# Patient Record
Sex: Male | Born: 1970 | Race: Black or African American | Hispanic: No | Marital: Single | Smoking: Never smoker
Health system: Southern US, Community
[De-identification: ages and names within clinical notes are randomized; demographics above are authoritative.]

## PROBLEM LIST (undated history)

## (undated) ENCOUNTER — Ambulatory Visit (HOSPITAL_COMMUNITY): Disposition: A | Discharge status: Home / Self Care | Payer: No Typology Code available for payment source

## (undated) DIAGNOSIS — L02212 Cutaneous abscess of back [any part, except buttock]: Secondary | ICD-10-CM

---

## 2005-08-07 ENCOUNTER — Emergency Department (HOSPITAL_COMMUNITY): Admission: EM | Admit: 2005-08-07 | Discharge: 2005-08-07 | Payer: Self-pay | Admitting: Emergency Medicine

## 2008-12-10 ENCOUNTER — Encounter: Admission: RE | Admit: 2008-12-10 | Discharge: 2008-12-10 | Payer: Self-pay | Admitting: Internal Medicine

## 2010-09-23 ENCOUNTER — Inpatient Hospital Stay (INDEPENDENT_AMBULATORY_CARE_PROVIDER_SITE_OTHER)
Admission: RE | Admit: 2010-09-23 | Discharge: 2010-09-23 | Disposition: A | Discharge status: Home / Self Care | Payer: BLUE CROSS/BLUE SHIELD | Source: Ambulatory Visit | Attending: Family Medicine | Admitting: Family Medicine

## 2010-09-23 DIAGNOSIS — K047 Periapical abscess without sinus: Secondary | ICD-10-CM

## 2011-02-19 ENCOUNTER — Inpatient Hospital Stay (INDEPENDENT_AMBULATORY_CARE_PROVIDER_SITE_OTHER)
Admission: RE | Admit: 2011-02-19 | Discharge: 2011-02-19 | Disposition: A | Discharge status: Home / Self Care | Payer: Self-pay | Source: Ambulatory Visit | Attending: Family Medicine | Admitting: Family Medicine

## 2011-02-19 DIAGNOSIS — L02419 Cutaneous abscess of limb, unspecified: Secondary | ICD-10-CM

## 2011-02-19 DIAGNOSIS — L03119 Cellulitis of unspecified part of limb: Secondary | ICD-10-CM

## 2011-02-21 ENCOUNTER — Inpatient Hospital Stay (HOSPITAL_COMMUNITY)
Admission: RE | Admit: 2011-02-21 | Discharge: 2011-02-21 | Disposition: A | Discharge status: Home / Self Care | Payer: Self-pay | Source: Ambulatory Visit | Attending: Family Medicine | Admitting: Family Medicine

## 2011-08-03 ENCOUNTER — Encounter (HOSPITAL_COMMUNITY): Payer: Self-pay | Admitting: *Deleted

## 2011-08-03 ENCOUNTER — Emergency Department (INDEPENDENT_AMBULATORY_CARE_PROVIDER_SITE_OTHER)
Admission: EM | Admit: 2011-08-03 | Discharge: 2011-08-03 | Disposition: A | Discharge status: Home Health | Payer: Self-pay | Source: Home / Self Care | Attending: Emergency Medicine | Admitting: Emergency Medicine

## 2011-08-03 DIAGNOSIS — L02219 Cutaneous abscess of trunk, unspecified: Secondary | ICD-10-CM

## 2011-08-03 DIAGNOSIS — L02212 Cutaneous abscess of back [any part, except buttock]: Secondary | ICD-10-CM

## 2011-08-03 MED ORDER — IBUPROFEN 600 MG PO TABS: 600.0000 mg | ORAL_TABLET | Freq: Four times a day (QID) | ORAL | Status: AC | PRN

## 2011-08-03 MED ORDER — CHLORHEXIDINE GLUCONATE 4 % EX LIQD: 60.0000 mL | Freq: Every day | CUTANEOUS | Status: AC | PRN

## 2011-08-03 MED ORDER — BACITRACIN 500 UNIT/GM EX OINT
1.0000 "application " | TOPICAL_OINTMENT | Freq: Once | CUTANEOUS | Status: AC
  Administered 2011-08-03: 1 via TOPICAL

## 2011-08-03 MED ORDER — SULFAMETHOXAZOLE-TRIMETHOPRIM 800-160 MG PO TABS: 1.0000 | ORAL_TABLET | Freq: Two times a day (BID) | ORAL | Status: AC

## 2011-08-03 NOTE — ED Notes (Signed)
Pt is here with what appears to be MRSA bump on mid back.  Onset 4 days ago.

## 2011-08-03 NOTE — ED Provider Notes (Signed)
History     CSN: [id number]  Arrival date & time 08/03/11  8657   First MD Initiated Contact with Patient 08/03/11 [phone]      Chief Complaint  Patient presents with  . Rash    (Consider location/radiation/quality/duration/timing/severity/associated sxs/prior treatment) HPI Comments: Patient with a painful, erythematous mass starting his midback approximately 4 days ago. States he scratched the area, and then the symptoms started. No nausea, vomiting, fevers. No purulent drainage. No other lesions elsewhere. Has not tried anything for this. No history of MRSA infections, or diabetes.  Patient is a 41 y.o. male presenting with rash. No language interpreter was used.  Rash  This is a new problem. The current episode started more than 2 days ago. The problem has been gradually worsening. The problem is associated with nothing. There has been no fever. The rash is present on the back. Associated symptoms include itching and pain. Pertinent negatives include no blisters. He has tried nothing for the symptoms. The treatment provided no relief.    History reviewed. No pertinent past medical history.  History reviewed. No pertinent past surgical history.  History reviewed. No pertinent family history.  History  Substance Use Topics  . Smoking status: Never Smoker   . Smokeless tobacco: Not on file  . Alcohol Use: Yes      Review of Systems  Constitutional: Negative for fever.  Gastrointestinal: Positive for nausea. Negative for vomiting.  Musculoskeletal: Positive for back pain.  Skin: Positive for itching and rash.    Allergies  Review of patient's allergies indicates no known allergies.  Home Medications   Current Outpatient Rx  Name Route Sig Dispense Refill  . CHLORHEXIDINE GLUCONATE 4 % EX LIQD Topical Apply 60 mLs (4 application total) topically daily as needed. Use daily for 1-2 weeks 120 mL 0  . IBUPROFEN 600 MG PO TABS Oral Take 1 tablet (600 mg total) by mouth  every 6 (six) hours as needed for pain. 30 tablet 0  . SULFAMETHOXAZOLE-TRIMETHOPRIM 800-160 MG PO TABS Oral Take 1 tablet by mouth 2 (two) times daily. 20 tablet 0    BP 110/76  Pulse 84  Temp(Src) 97 F (36.1 C) (Oral)  Resp 16  SpO2 97%  Physical Exam  Nursing note and vitals reviewed. Constitutional: He is oriented to person, place, and time. He appears well-developed and well-nourished.  HENT:  Head: Normocephalic and atraumatic.  Eyes: Conjunctivae and EOM are normal.  Neck: Normal range of motion.  Cardiovascular: Normal rate.   Pulmonary/Chest: Effort normal. No respiratory distress.  Abdominal: He exhibits no distension.  Musculoskeletal: Normal range of motion.  Neurological: He is alert and oriented to person, place, and time.  Skin: Skin is warm and dry.       1 x 1.5 cm area of redness, induration. Central whitehead.  Psychiatric: He has a normal mood and affect. His behavior is normal.    ED Course  INCISION AND DRAINAGE Date/Time: 08/03/2011 9:42 AM Performed by: Luiz Blare Authorized by: Luiz Blare Consent: Verbal consent obtained. Risks and benefits: risks, benefits and alternatives were discussed Consent given by: patient Patient understanding: patient states understanding of the procedure being performed Patient consent: the patient's understanding of the procedure matches consent given Site marked: the operative site was marked Required items: required blood products, implants, devices, and special equipment available Patient identity confirmed: verbally with patient Time out: Immediately prior to procedure a "time out" was called to verify the correct patient, procedure, equipment, support  staff and site/side marked as required. Type: abscess Body area: trunk Location details: back Patient sedated: no Needle gauge: 18 Incision type: single straight Complexity: simple Drainage: purulent and bloody Drainage amount: scant Wound  treatment: wound left open Patient tolerance: Patient tolerated the procedure well with no immediate complications. Comments: Applied bacitracin, sterile dressing.   (including critical care time)   Labs Reviewed  CULTURE, ROUTINE-ABSCESS   No results found.   1. Abscess of back       MDM  Performed needle drainage of abscess. Marked margins of induration with permanent marker. Measured approximately 1 x 1.5 cm. Self on call to confirm MRSA Sending home with Bactrim, pain medication. Was advised to start warm compresses. Patient followup here or with his Dr. in 3 days if no improvement.  Luiz Blare, MD 08/03/11 [phone]

## 2011-08-05 LAB — CULTURE, ROUTINE-ABSCESS: Gram Stain: NONE SEEN

## 2011-08-07 ENCOUNTER — Telehealth (HOSPITAL_COMMUNITY): Payer: Self-pay | Admitting: *Deleted

## 2011-08-07 NOTE — ED Notes (Signed)
Abscess culture back: Mod. MRSA.  Pt. Adequately treated with Septra DS. Pt.verified x 2 and given result. Pt. Given MRSA instructions and voiced understanding.  Pt. said its still hard and stopped draining. I told him it may need an I and D. Pt. instructed to come back if not better after finishing his antibiotics. Jeremy Singh 08/07/2011

## 2011-11-21 ENCOUNTER — Encounter (HOSPITAL_COMMUNITY): Payer: Self-pay | Admitting: *Deleted

## 2011-11-21 ENCOUNTER — Emergency Department (INDEPENDENT_AMBULATORY_CARE_PROVIDER_SITE_OTHER)
Admission: EM | Admit: 2011-11-21 | Discharge: 2011-11-21 | Disposition: A | Discharge status: Home / Self Care | Payer: Self-pay | Source: Home / Self Care | Attending: Emergency Medicine | Admitting: Emergency Medicine

## 2011-11-21 DIAGNOSIS — L03119 Cellulitis of unspecified part of limb: Secondary | ICD-10-CM

## 2011-11-21 DIAGNOSIS — L03115 Cellulitis of right lower limb: Secondary | ICD-10-CM

## 2011-11-21 MED ORDER — HYDROCODONE-ACETAMINOPHEN 5-325 MG PO TABS: 2.0000 | ORAL_TABLET | ORAL | Status: AC | PRN

## 2011-11-21 MED ORDER — SULFAMETHOXAZOLE-TRIMETHOPRIM 800-160 MG PO TABS: 1.0000 | ORAL_TABLET | Freq: Two times a day (BID) | ORAL | Status: AC

## 2011-11-21 MED ORDER — IBUPROFEN 600 MG PO TABS: 600.0000 mg | ORAL_TABLET | Freq: Four times a day (QID) | ORAL | Status: AC | PRN

## 2011-11-21 MED ORDER — BACITRACIN 500 UNIT/GM EX OINT
1.0000 "application " | TOPICAL_OINTMENT | Freq: Once | CUTANEOUS | Status: AC
  Administered 2011-11-21: 1 via TOPICAL

## 2011-11-21 NOTE — ED Provider Notes (Signed)
History     CSN: [id number]  Arrival date & time 11/21/11  1145   First MD Initiated Contact with Patient 11/21/11 1234      Chief Complaint  Patient presents with  . Leg Pain  . Torticollis    (Consider location/radiation/quality/duration/timing/severity/associated sxs/prior treatment) HPI Comments: Patient with history of recurrent skin infections comes in with right lower extremity erythema, pain, swelling starting yesterday. States that he had a "bump" on the posterior part of his leg, which he scratched, and then his leg started to swell up. Has been putting rubbing alcohol on this. Pain is worse with palpation. No alleviating factors. Has not tried anything other than alcohol. He is not a diabetic.  ROS as noted in HPI. All other ROS negative.   Patient is a 41 y.o. male presenting with abscess. The history is provided by the patient. No language interpreter was used.  Abscess  The abscess is present on the right lower leg. The problem is moderate. The abscess is characterized by redness and painfulness. It is unknown what he was exposed to. The abscess first occurred at home. Pertinent negatives include no fever.    History reviewed. No pertinent past medical history.  History reviewed. No pertinent past surgical history.  History reviewed. No pertinent family history.  History  Substance Use Topics  . Smoking status: Never Smoker   . Smokeless tobacco: Not on file  . Alcohol Use: Yes      Review of Systems  Constitutional: Negative for fever.    Allergies  Review of patient's allergies indicates no known allergies.  Home Medications   Current Outpatient Rx  Name Route Sig Dispense Refill  . HYDROCODONE-ACETAMINOPHEN 5-325 MG PO TABS Oral Take 2 tablets by mouth every 4 (four) hours as needed for pain. 20 tablet 0  . IBUPROFEN 600 MG PO TABS Oral Take 1 tablet (600 mg total) by mouth every 6 (six) hours as needed for pain. 30 tablet 0  .  SULFAMETHOXAZOLE-TRIMETHOPRIM 800-160 MG PO TABS Oral Take 1 tablet by mouth 2 (two) times daily. X 10 days 20 tablet 0    BP 119/68  Pulse 74  Temp 98.2 F (36.8 C) (Oral)  Resp 18  SpO2 98%  Physical Exam  Nursing note and vitals reviewed. Constitutional: He is oriented to person, place, and time. He appears well-developed and well-nourished.  HENT:  Head: Normocephalic and atraumatic.  Eyes: Conjunctivae and EOM are normal.  Neck: Normal range of motion.  Cardiovascular: Normal rate.   Pulmonary/Chest: Effort normal. No respiratory distress.  Abdominal: He exhibits no distension.  Musculoskeletal: Normal range of motion.       Right lower leg: He exhibits tenderness and edema.       Legs:      Tender nonpitting erythema right lower extremity, see drawing. Central lesion with small amount of fluctuance. No expressible drainage.  Neurological: He is alert and oriented to person, place, and time.  Skin: Skin is warm and dry.  Psychiatric: He has a normal mood and affect. His behavior is normal.    ED Course  INCISION AND DRAINAGE Date/Time: 11/21/2011 11:53 AM Performed by: Luiz Blare Authorized by: Luiz Blare Consent: Verbal consent obtained. Risks and benefits: risks, benefits and alternatives were discussed Consent given by: patient Patient understanding: patient states understanding of the procedure being performed Patient consent: the patient's understanding of the procedure matches consent given Required items: required blood products, implants, devices, and special equipment available Patient identity  confirmed: verbally with patient Time out: Immediately prior to procedure a "time out" was called to verify the correct patient, procedure, equipment, support staff and site/side marked as required. Type: abscess Body area: lower extremity Location details: right leg Local anesthetic: topical anesthetic Patient sedated: no Scalpel size: 11 Incision  type: cruciate. Complexity: simple Drainage: purulent Drainage amount: scant Wound treatment: wound left open Patient tolerance: Patient tolerated the procedure well with no immediate complications. Comments: Applied bacitracin and sterile pressure dressing.   (including critical care time)  Labs Reviewed - No data to display No results found.   1. Cellulitis of right lower leg       MDM  Exam is consistent with MRSA infection. Will send home with Bactrim, ice, elevation, Norco, NSAIDs. Will have him return here or follow up with his doctor for wound check in 2 days. Discussed signs and symptoms that should prompt his return to the department. Patient agrees with plan.     Luiz Blare, MD 11/22/11 1155

## 2011-11-21 NOTE — Discharge Instructions (Signed)
Go to www.goodrx.com to look up your medications. This will give you a list of where you can find your prescriptions at the most affordable prices.   Call [facility]  [phone]  If you have no primary doctor, here are some resources that may be helpful:  Medicaid-accepting [place] Providers:   - [facility]- [address]      [phone]      Mon-Fri 9am-7pm, Sat 9am-1pm   - [facility]- [address], [address]      [phone]   - [facility]- [address]      [phone]   - [facility]- [address] [place] [address]      [phone]   - [facility]- [address]      [phone]      Only accepts [place] Access Medicaid patients       after they have her name applied to their card   Self Pay (no insurance) in [place]:   - Sickle Cell Patients: Dr Eric Dean, [facility]      [address]      [phone]   - [facility]- [phone]   - Physician Referral Service- [phone]   - [facility]- [address]      [phone]   - [facility]- [address]   - [facility]- see information above      (Speak to Pam H if you do not have insurance)   - [facility]- [address]      [phone]   - [facility] [place]- [address]      [phone]   - [facility]- [address] [place] [address]      [phone]   - Dr Osei-Bonsu-  [address], [place]      [phone]   - [facility]- [address]      [phone]   - [facility]- [address] [place] [address]      [phone]      Also [address]      [phone]   - [facility]- [address]      [phone]      1st and 3rd Saturday every month, 10am-1pm    Other agencies that provide inexpensive medical care:     [facility]  [phone]    [facility]  [phone]    [facility]  [phone]    [facility]  [phone] [place] [address]    [facility]  [phone]    [facility]  [phone] [facility] [phone]   [address] [place], [place] [address]  Chronic Pain Problems Contact [facility]  [phone] Patients need to be referred by their primary care doctor.  [place] Resources  [facility] [place]     [employer]                          [place] [facility] [address] [place]                         [address]      [address]   [phone] (After Hours)  General Information: Finding a doctor when you do not have health insurance can be tricky. Although you are not limited by an insurance plan, you are of course limited by her finances and how much but he can pay out of pocket.  What are your options if you don't have health insurance?   1) Find a Doctor and Pay Out of Pocket Although you won't have to find out who is covered by your insurance plan, it is a good idea to ask around and get recommendations. You will then need to call the office and see if the doctor you have chosen will accept you as a new patient and what types of options they offer for patients who are self-pay. Some doctors offer discounts or will set up payment plans for their patients who do not have insurance, but you will need to ask so you aren't surprised when you get to your appointment.  2) Contact Your Local Health Department Not all health departments have doctors that can see patients for sick visits, but many do, so it is worth a call to see if yours does. If you don't know where your local health department is, you can check in your phone book. The CDC also has a tool to help you locate your state's health department, and many state websites also have listings of  all of their local health departments.  3) Find a Walk-in Clinic If your illness is not likely to be very severe or complicated, you may want to try a walk in clinic. These are popping up all over the country in pharmacies, drugstores, and shopping centers. They're usually staffed by nurse practitioners or physician assistants that have been trained to treat common illnesses and complaints. They're usually fairly quick and inexpensive. However, if you have serious medical issues or chronic medical problems, these are probably not your best option     

## 2011-11-21 NOTE — ED Notes (Signed)
Patient is resting comfortably. 

## 2011-12-25 ENCOUNTER — Encounter (HOSPITAL_COMMUNITY): Payer: Self-pay

## 2011-12-25 ENCOUNTER — Emergency Department (HOSPITAL_COMMUNITY)
Admission: EM | Admit: 2011-12-25 | Discharge: 2011-12-25 | Disposition: A | Discharge status: Home / Self Care | Payer: Self-pay | Attending: Emergency Medicine | Admitting: Emergency Medicine

## 2011-12-25 DIAGNOSIS — L02419 Cutaneous abscess of limb, unspecified: Secondary | ICD-10-CM | POA: Insufficient documentation

## 2011-12-25 DIAGNOSIS — L02214 Cutaneous abscess of groin: Secondary | ICD-10-CM

## 2011-12-25 HISTORY — DX: Cutaneous abscess of back (any part, except buttock and flank): L02.212

## 2011-12-25 MED ORDER — ACETAMINOPHEN-CODEINE #3 300-30 MG PO TABS: 1.0000 | ORAL_TABLET | Freq: Four times a day (QID) | ORAL | Status: AC | PRN

## 2011-12-25 MED ORDER — IBUPROFEN 800 MG PO TABS: 800.0000 mg | ORAL_TABLET | Freq: Three times a day (TID) | ORAL | Status: AC

## 2011-12-25 MED ORDER — SULFAMETHOXAZOLE-TRIMETHOPRIM 800-160 MG PO TABS: 1.0000 | ORAL_TABLET | Freq: Two times a day (BID) | ORAL | Status: AC

## 2011-12-25 NOTE — ED Provider Notes (Signed)
Medical screening examination/treatment/procedure(s) were performed by non-physician practitioner and as supervising physician I was immediately available for consultation/collaboration.  Cheri Guppy, MD 12/25/11 [phone]

## 2011-12-25 NOTE — ED Notes (Signed)
Pt has been to urgent care previously for same, has an abscess to inner right thigh that started to be more painful since Thursday last week, a knot developed Friday. 0

## 2011-12-25 NOTE — ED Notes (Signed)
Assessed wound s/p I&D. Pea sized area of redness, swelling. Dressing, clean, dry, intact.

## 2011-12-25 NOTE — ED Provider Notes (Signed)
History     CSN: [id number]  Arrival date & time 12/25/11  0757   First MD Initiated Contact with Patient 12/25/11 0802      Chief Complaint  Patient presents with  . Abscess    (Consider location/radiation/quality/duration/timing/severity/associated sxs/prior treatment) HPI  Patient presents to emergency department complaining of a few day history of gradual onset pain, swelling, and drainage from his right inguinal region. Patient states that over the last 10 years he's had multiple episodes of abscesses that required drainage on different areas of his body most recently requiring an abscess to drained on his back and his lower leg at an urgent care. Patient states that symptoms are similar to his past symptoms of abscess. Patient denies fevers, chills, nausea, or vomiting. Patient's been taking over-the-counter pain medications with minimal relief of symptoms. Patient states that due to the location of abscess and swelling and inguinal region he has increasing pain due to ambulation because of friction.  Past Medical History  Diagnosis Date  . Abscess of back     multiple other sites    History reviewed. No pertinent past surgical history.  No family history on file.  History  Substance Use Topics  . Smoking status: Never Smoker   . Smokeless tobacco: Not on file  . Alcohol Use: Yes     occasionally      Review of Systems  All other systems reviewed and are negative.    Allergies  Review of patient's allergies indicates no known allergies.  Home Medications   Current Outpatient Rx  Name Route Sig Dispense Refill  . GLUCOSAMINE PO Oral Take 1 tablet by mouth daily.    . ACETAMINOPHEN-CODEINE #3 300-30 MG PO TABS Oral Take 1-2 tablets by mouth every 6 (six) hours as needed for pain. 15 tablet 0  . IBUPROFEN 800 MG PO TABS Oral Take 1 tablet (800 mg total) by mouth 3 (three) times daily. 21 tablet 0  . SULFAMETHOXAZOLE-TRIMETHOPRIM 800-160 MG PO TABS Oral Take 1  tablet by mouth 2 (two) times daily. 14 tablet 0    BP 130/62  Pulse 95  Temp 98.3 F (36.8 C) (Oral)  Resp 20  SpO2 100%  Physical Exam  Nursing note and vitals reviewed. Constitutional: He is oriented to person, place, and time. He appears well-developed and well-nourished. No distress.  HENT:  Head: Normocephalic and atraumatic.  Eyes: Conjunctivae are normal.  Cardiovascular: Normal rate, regular rhythm, normal heart sounds and intact distal pulses.  Exam reveals no gallop and no friction rub.   No murmur heard. Pulmonary/Chest: Effort normal and breath sounds normal. No respiratory distress. He has no wheezes. He has no rales. He exhibits no tenderness.  Abdominal: Soft. Bowel sounds are normal. He exhibits no distension and no mass. There is no tenderness. There is no rebound and no guarding.  Musculoskeletal: Normal range of motion. He exhibits tenderness. He exhibits no edema.       See skin exam. TTP over abscess but no TTP of thigh or abdomen.   Neurological: He is alert and oriented to person, place, and time.  Skin: Skin is warm and dry. No rash noted. He is not diaphoretic. There is erythema.       Quarter sized area of erythema and induration of right inguinal region with central area of ulceration with scant pustular drainage. TTP but no crepitous. No streaking erythema.   Psychiatric: He has a normal mood and affect.    ED Course  Procedures (  including critical care time)  INCISION AND DRAINAGE Performed by: Drucie Opitz Consent: Verbal consent obtained. Risks and benefits: risks, benefits and alternatives were discussed Type: abscess  Body area: right inguinal region.   Anesthesia: local infiltration  Local anesthetic: lidocaine 2% with epinephrine  Anesthetic total: 5 ml  Complexity: complex Blunt dissection to break up loculations  Drainage: purulent  Drainage amount: small amount  Packing material: none  Patient tolerance: Patient tolerated  the procedure well with no immediate complications.  Patient drove self to ER but states pain relief after local anesthetic.    Labs Reviewed - No data to display No results found.   1. Abscess of right groin       MDM  Small abscess of right inguinal region with a small area of cellulitis no concern for deeper infection. Afebrile. Entire thigh and abdomen non-tender.        [place], [place] 12/25/11 [phone]

## 2014-02-04 ENCOUNTER — Ambulatory Visit (INDEPENDENT_AMBULATORY_CARE_PROVIDER_SITE_OTHER): Payer: Self-pay | Admitting: Family Medicine

## 2014-02-04 VITALS — BP 124/82 | HR 86 | Temp 97.7°F | Resp 16 | Ht 66.5 in | Wt 192.0 lb

## 2014-02-04 DIAGNOSIS — N433 Hydrocele, unspecified: Secondary | ICD-10-CM

## 2014-02-04 DIAGNOSIS — B356 Tinea cruris: Secondary | ICD-10-CM

## 2014-02-04 NOTE — Patient Instructions (Signed)
Use over the counter terbinafine (Lamisil AT- or generic) twice a day until the rash is gone- then use for one more week. Can take 4-5 weeks to resolve.Jock Itch Jock itch is a fungal infection of the skin in the groin area. It is sometimes called "ringworm" even though it is not caused by a worm. A fungus is a type of germ that thrives in dark, damp places.  CAUSES  This infection may spread from:  A fungus infection elsewhere on the body (such as athlete's foot).  Sharing towels or clothing. This infection is more common in:  Hot, humid climates.  People who wear tight-fitting clothing or wet bathing suits for long periods of time.  Athletes.  Overweight people.  People with diabetes. SYMPTOMS  Jock itch causes the following symptoms:  Red, pink or brown rash in the groin. Rash may spread to the thighs, anus, and buttocks.  Itching. DIAGNOSIS  Your caregiver may make the diagnosis by looking at the rash. Sometimes a skin scraping will be sent to test for fungus. Testing can be done either by looking under the microscope or by doing a culture (test to try to grow the fungus). A culture can take up to 2 weeks to come back. TREATMENT  Jock itch may be treated with:  Skin cream or ointment to kill fungus.  Medicine by mouth to kill fungus.  Skin cream or ointment to calm the itching.  Compresses or medicated powders to dry the infected skin. HOME CARE INSTRUCTIONS   Be sure to treat the rash completely. Follow your caregiver's instructions. It can take a couple of weeks to treat. If you do not treat the infection long enough, the rash can come back.  Wear loose-fitting clothing.  Men should wear cotton boxer shorts.  Women should wear cotton underwear.  Avoid hot baths.  Dry the groin area well after bathing. SEEK MEDICAL CARE IF:   Your rash is worse.  Your rash is spreading.  Your rash returns after treatment is finished.  Your rash is not gone in 4 weeks.  Fungal infections are slow to respond to treatment. Some redness may remain for several weeks after the fungus is gone. SEEK IMMEDIATE MEDICAL CARE IF:  The area becomes red, warm, tender, and swollen.  You have a fever. Document Released: 05/18/2002 Document Revised: 08/20/2011 Document Reviewed: 04/16/2008 [facility] Patient Information 2015 [place], [place]. This information is not intended to replace advice given to you by your health care provider. Make sure you discuss any questions you have with your health care provider.

## 2014-02-04 NOTE — Progress Notes (Signed)
   Subjective:    Patient ID: Jeremy Singh, male    DOB: 12/11/1970, 43 y.o.   MRN: [id number]  HPI Patient is a 43 yo male who presents today with rash on his groin for 2 weeks. Has been using tenactin, gold bond and desitin. Has had some relief with itching, but rash persists. 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   He reports some chronic swelling in his groin since 2013. He was scheduled for surgery, but lost his insurance and was unable to afford surgery. He had pain with this at first, but states it is no longer painful. It is not worsened with lifting.   Past Medical History  Diagnosis Date  . Abscess of back     multiple other sites   No past surgical history on file. Family History  Problem Relation Age of Onset  . Cancer Mother   . Cancer Father    History  Substance Use Topics  . Smoking status: Never Smoker   . Smokeless tobacco: Not on file  . Alcohol Use: Yes     Comment: occasionally   Review of Systems No fever, no chest pain, no shortness of breath    Objective:   Physical Exam  Vitals reviewed. Constitutional: He is oriented to person, place, and time. He appears well-developed and well-nourished.  HENT:  Head: Normocephalic and atraumatic.  Right Ear: External ear normal.  Left Ear: External ear normal.  Mouth/Throat: Oropharynx is clear and moist.  Eyes: Conjunctivae are normal. Pupils are equal, round, and reactive to light.  Neck: Normal range of motion. Neck supple.  Cardiovascular: Normal rate, regular rhythm and normal heart sounds.   Pulmonary/Chest: Effort normal and breath sounds normal.  Abdominal: Soft.  Genitourinary: Penis normal. Circumcised.  Very large (grapefruit sized) scrotal hydrocele.  Musculoskeletal: Normal range of motion.  Neurological: He is alert and oriented to person, place, and time.  Skin: Skin is warm and dry. Rash (bilateral groin area and upper, inner thighs with scattered papules. No drainage.  Evidence of application of cream.) noted.  Psychiatric: He has a normal mood and affect. His behavior is normal. Judgment and thought content normal.      Assessment & Plan:  1. Tinea cruris -Provided written and verbal information regarding diagnosis and treatment. -  Patient Instructions  Use over the counter terbinafine (Lamisil AT- or generic) twice a day until the rash is gone- then use for one more week. Can take 4-5 weeks to resolve.  2. Chronic hydrocele -Patient uninsured, will put in referral to [facility] teaching service.   Emi Belfast, FNP-BC  [facility]  02/06/2014 7:36 PM

## 2015-10-03 ENCOUNTER — Emergency Department (HOSPITAL_COMMUNITY): Payer: Self-pay

## 2015-10-03 ENCOUNTER — Observation Stay (HOSPITAL_COMMUNITY): Payer: Self-pay | Admitting: Anesthesiology

## 2015-10-03 ENCOUNTER — Inpatient Hospital Stay (HOSPITAL_COMMUNITY)
Admission: EM | Admit: 2015-10-03 | Discharge: 2015-10-04 | DRG: 352 | Disposition: A | Discharge status: Home / Self Care | Payer: Self-pay | Attending: Surgery | Admitting: Surgery

## 2015-10-03 ENCOUNTER — Encounter (HOSPITAL_COMMUNITY): Payer: Self-pay

## 2015-10-03 ENCOUNTER — Encounter (HOSPITAL_COMMUNITY): Admission: EM | Disposition: A | Discharge status: Home / Self Care | Payer: Self-pay | Source: Home / Self Care

## 2015-10-03 DIAGNOSIS — N5082 Scrotal pain: Secondary | ICD-10-CM | POA: Diagnosis present

## 2015-10-03 DIAGNOSIS — K4090 Unilateral inguinal hernia, without obstruction or gangrene, not specified as recurrent: Secondary | ICD-10-CM

## 2015-10-03 DIAGNOSIS — K4030 Unilateral inguinal hernia, with obstruction, without gangrene, not specified as recurrent: Principal | ICD-10-CM | POA: Diagnosis present

## 2015-10-03 HISTORY — PX: INGUINAL HERNIA REPAIR: SHX194

## 2015-10-03 HISTORY — PX: INSERTION OF MESH: SHX5868

## 2015-10-03 LAB — CBC WITH DIFFERENTIAL/PLATELET
BASOS PCT: 0 %
Basophils Absolute: 0 10*3/uL (ref 0.0–0.1)
EOS ABS: 0.1 10*3/uL (ref 0.0–0.7)
Eosinophils Relative: 2 %
HCT: 41.5 % (ref 39.0–52.0)
Hemoglobin: 13.8 g/dL (ref 13.0–17.0)
LYMPHS ABS: 2.4 10*3/uL (ref 0.7–4.0)
Lymphocytes Relative: 37 %
MCH: 30.9 pg (ref 26.0–34.0)
MCHC: 33.3 g/dL (ref 30.0–36.0)
MCV: 93 fL (ref 78.0–100.0)
MONO ABS: 0.5 10*3/uL (ref 0.1–1.0)
MONOS PCT: 8 %
NEUTROS PCT: 53 %
Neutro Abs: 3.4 10*3/uL (ref 1.7–7.7)
Platelets: 252 10*3/uL (ref 150–400)
RBC: 4.46 MIL/uL (ref 4.22–5.81)
RDW: 12.4 % (ref 11.5–15.5)
WBC: 6.4 10*3/uL (ref 4.0–10.5)

## 2015-10-03 LAB — COMPREHENSIVE METABOLIC PANEL
ALBUMIN: 3.7 g/dL (ref 3.5–5.0)
ALK PHOS: 51 U/L (ref 38–126)
ALT: 20 U/L (ref 17–63)
AST: 25 U/L (ref 15–41)
Anion gap: 14 (ref 5–15)
BUN: 13 mg/dL (ref 6–20)
CALCIUM: 8.8 mg/dL — AB (ref 8.9–10.3)
CHLORIDE: 108 mmol/L (ref 101–111)
CO2: 19 mmol/L — AB (ref 22–32)
CREATININE: 1.16 mg/dL (ref 0.61–1.24)
GFR calc Af Amer: >60 mL/min (ref >60)
GFR calc non Af Amer: >60 mL/min (ref >60)
GLUCOSE: 183 mg/dL — AB (ref 65–99)
Potassium: 2.9 mmol/L — ABNORMAL LOW (ref 3.5–5.1)
SODIUM: 141 mmol/L (ref 135–145)
Total Bilirubin: 1.1 mg/dL (ref 0.3–1.2)
Total Protein: 6.8 g/dL (ref 6.5–8.1)

## 2015-10-03 LAB — TYPE AND SCREEN
ABO/RH(D): A POS
Antibody Screen: NEGATIVE

## 2015-10-03 LAB — ABO/RH: ABO/RH(D): A POS

## 2015-10-03 LAB — PROTIME-INR
INR: 1 (ref 0.00–1.49)
PROTHROMBIN TIME: 13.4 s (ref 11.6–15.2)

## 2015-10-03 SURGERY — REPAIR, HERNIA, INGUINAL, INCARCERATED
Anesthesia: General | Site: Groin | Laterality: Right

## 2015-10-03 MED ORDER — DIAZEPAM 5 MG/ML IJ SOLN
10.0000 mg | Freq: Once | INTRAMUSCULAR | Status: AC
  Administered 2015-10-03: 5 mg via INTRAVENOUS
  Filled 2015-10-03: qty 2

## 2015-10-03 MED ORDER — BUPIVACAINE-EPINEPHRINE (PF) 0.25% -1:200000 IJ SOLN
INTRAMUSCULAR | Status: AC
  Filled 2015-10-03: qty 30

## 2015-10-03 MED ORDER — ONDANSETRON HCL 4 MG/2ML IJ SOLN
4.0000 mg | Freq: Four times a day (QID) | INTRAMUSCULAR | Status: DC | PRN
  Administered 2015-10-04: 4 mg via INTRAVENOUS
  Filled 2015-10-03: qty 2

## 2015-10-03 MED ORDER — CEFAZOLIN SODIUM-DEXTROSE 2-4 GM/100ML-% IV SOLN
2.0000 g | INTRAVENOUS | Status: AC
  Administered 2015-10-03: 2 g via INTRAVENOUS
  Filled 2015-10-03: qty 100

## 2015-10-03 MED ORDER — SUGAMMADEX SODIUM 200 MG/2ML IV SOLN
INTRAVENOUS | Status: AC
  Filled 2015-10-03: qty 2

## 2015-10-03 MED ORDER — 0.9 % SODIUM CHLORIDE (POUR BTL) OPTIME
TOPICAL | Status: DC | PRN
  Administered 2015-10-03: 1000 mL

## 2015-10-03 MED ORDER — ROCURONIUM BROMIDE 100 MG/10ML IV SOLN
INTRAVENOUS | Status: DC | PRN
  Administered 2015-10-03: 50 mg via INTRAVENOUS
  Administered 2015-10-03 (×2): 10 mg via INTRAVENOUS

## 2015-10-03 MED ORDER — PHENYLEPHRINE 40 MCG/ML (10ML) SYRINGE FOR IV PUSH (FOR BLOOD PRESSURE SUPPORT)
PREFILLED_SYRINGE | INTRAVENOUS | Status: AC
  Filled 2015-10-03: qty 10

## 2015-10-03 MED ORDER — HYDROMORPHONE HCL 1 MG/ML IJ SOLN: 0.2500 mg | INTRAMUSCULAR | Status: DC | PRN

## 2015-10-03 MED ORDER — SODIUM CHLORIDE 0.9 % IV SOLN
INTRAVENOUS | Status: DC
  Administered 2015-10-03: 08:00:00 via INTRAVENOUS

## 2015-10-03 MED ORDER — HYDROMORPHONE HCL 1 MG/ML IJ SOLN
1.0000 mg | Freq: Once | INTRAMUSCULAR | Status: AC
  Administered 2015-10-03: 1 mg via INTRAVENOUS
  Filled 2015-10-03: qty 1

## 2015-10-03 MED ORDER — MEPERIDINE HCL 25 MG/ML IJ SOLN: 6.2500 mg | INTRAMUSCULAR | Status: DC | PRN

## 2015-10-03 MED ORDER — HYDROMORPHONE HCL 1 MG/ML IJ SOLN
1.0000 mg | INTRAMUSCULAR | Status: DC | PRN
  Administered 2015-10-03: 1 mg via INTRAVENOUS
  Filled 2015-10-03 (×3): qty 1

## 2015-10-03 MED ORDER — DIPHENHYDRAMINE HCL 50 MG/ML IJ SOLN
25.0000 mg | Freq: Four times a day (QID) | INTRAMUSCULAR | Status: DC | PRN
  Administered 2015-10-04: 25 mg via INTRAVENOUS
  Filled 2015-10-03: qty 1

## 2015-10-03 MED ORDER — LIDOCAINE HCL (CARDIAC) 20 MG/ML IV SOLN
INTRAVENOUS | Status: DC | PRN
  Administered 2015-10-03: 60 mg via INTRAVENOUS

## 2015-10-03 MED ORDER — DIPHENHYDRAMINE HCL 25 MG PO CAPS: 25.0000 mg | ORAL_CAPSULE | Freq: Four times a day (QID) | ORAL | Status: DC | PRN

## 2015-10-03 MED ORDER — IOPAMIDOL (ISOVUE-300) INJECTION 61%
INTRAVENOUS | Status: AC
  Administered 2015-10-03: 100 mL
  Filled 2015-10-03: qty 100

## 2015-10-03 MED ORDER — LACTATED RINGERS IV SOLN: INTRAVENOUS | Status: DC

## 2015-10-03 MED ORDER — LACTATED RINGERS IV SOLN
INTRAVENOUS | Status: DC
  Administered 2015-10-03: 10:00:00 via INTRAVENOUS

## 2015-10-03 MED ORDER — POTASSIUM CHLORIDE IN NACL 20-0.9 MEQ/L-% IV SOLN
INTRAVENOUS | Status: DC
  Administered 2015-10-03 – 2015-10-04 (×2): via INTRAVENOUS
  Filled 2015-10-03 (×3): qty 1000

## 2015-10-03 MED ORDER — SUGAMMADEX SODIUM 200 MG/2ML IV SOLN
INTRAVENOUS | Status: DC | PRN
  Administered 2015-10-03: 200 mg via INTRAVENOUS

## 2015-10-03 MED ORDER — FENTANYL CITRATE (PF) 100 MCG/2ML IJ SOLN
INTRAMUSCULAR | Status: DC | PRN
  Administered 2015-10-03 (×2): 50 ug via INTRAVENOUS

## 2015-10-03 MED ORDER — SUCCINYLCHOLINE CHLORIDE 20 MG/ML IJ SOLN
INTRAMUSCULAR | Status: AC
  Filled 2015-10-03: qty 1

## 2015-10-03 MED ORDER — ACETAMINOPHEN 325 MG PO TABS: 650.0000 mg | ORAL_TABLET | Freq: Four times a day (QID) | ORAL | Status: DC | PRN

## 2015-10-03 MED ORDER — DEXAMETHASONE SODIUM PHOSPHATE 4 MG/ML IJ SOLN
INTRAMUSCULAR | Status: AC
  Filled 2015-10-03: qty 1

## 2015-10-03 MED ORDER — ONDANSETRON HCL 4 MG/2ML IJ SOLN
INTRAMUSCULAR | Status: AC
  Filled 2015-10-03: qty 2

## 2015-10-03 MED ORDER — ROCURONIUM BROMIDE 50 MG/5ML IV SOLN
INTRAVENOUS | Status: AC
  Filled 2015-10-03: qty 2

## 2015-10-03 MED ORDER — ONDANSETRON HCL 4 MG/2ML IJ SOLN
4.0000 mg | Freq: Once | INTRAMUSCULAR | Status: AC
  Administered 2015-10-03: 4 mg via INTRAVENOUS
  Filled 2015-10-03: qty 2

## 2015-10-03 MED ORDER — OXYCODONE-ACETAMINOPHEN 5-325 MG PO TABS
1.0000 | ORAL_TABLET | ORAL | Status: DC | PRN
  Administered 2015-10-03 – 2015-10-04 (×2): 2 via ORAL
  Filled 2015-10-03 (×2): qty 2

## 2015-10-03 MED ORDER — SUCCINYLCHOLINE CHLORIDE 20 MG/ML IJ SOLN
INTRAMUSCULAR | Status: DC | PRN
  Administered 2015-10-03: 100 mg via INTRAVENOUS

## 2015-10-03 MED ORDER — HYDROMORPHONE HCL 1 MG/ML IJ SOLN
2.0000 mg | Freq: Once | INTRAMUSCULAR | Status: AC
  Administered 2015-10-03: 2 mg via INTRAVENOUS
  Filled 2015-10-03: qty 2

## 2015-10-03 MED ORDER — PROPOFOL 10 MG/ML IV BOLUS
INTRAVENOUS | Status: DC | PRN
  Administered 2015-10-03: 200 mg via INTRAVENOUS

## 2015-10-03 MED ORDER — MIDAZOLAM HCL 2 MG/2ML IJ SOLN
INTRAMUSCULAR | Status: AC
  Filled 2015-10-03: qty 2

## 2015-10-03 MED ORDER — HYDRALAZINE HCL 20 MG/ML IJ SOLN: 10.0000 mg | INTRAMUSCULAR | Status: DC | PRN

## 2015-10-03 MED ORDER — ROCURONIUM BROMIDE 50 MG/5ML IV SOLN
INTRAVENOUS | Status: AC
  Filled 2015-10-03: qty 1

## 2015-10-03 MED ORDER — FENTANYL CITRATE (PF) 250 MCG/5ML IJ SOLN
INTRAMUSCULAR | Status: AC
  Filled 2015-10-03: qty 5

## 2015-10-03 MED ORDER — LIDOCAINE HCL (CARDIAC) 20 MG/ML IV SOLN
INTRAVENOUS | Status: AC
  Filled 2015-10-03: qty 5

## 2015-10-03 MED ORDER — DEXAMETHASONE SODIUM PHOSPHATE 4 MG/ML IJ SOLN
INTRAMUSCULAR | Status: DC | PRN
  Administered 2015-10-03: 4 mg via INTRAVENOUS

## 2015-10-03 MED ORDER — PROPOFOL 10 MG/ML IV BOLUS
INTRAVENOUS | Status: AC
  Filled 2015-10-03: qty 20

## 2015-10-03 MED ORDER — ACETAMINOPHEN 650 MG RE SUPP: 650.0000 mg | Freq: Four times a day (QID) | RECTAL | Status: DC | PRN

## 2015-10-03 MED ORDER — ONDANSETRON HCL 4 MG/2ML IJ SOLN
INTRAMUSCULAR | Status: DC | PRN
  Administered 2015-10-03: 4 mg via INTRAVENOUS

## 2015-10-03 MED ORDER — PHENYLEPHRINE HCL 10 MG/ML IJ SOLN
INTRAMUSCULAR | Status: DC | PRN
  Administered 2015-10-03 (×2): 80 ug via INTRAVENOUS

## 2015-10-03 MED ORDER — BUPIVACAINE-EPINEPHRINE 0.25% -1:200000 IJ SOLN
INTRAMUSCULAR | Status: DC | PRN
  Administered 2015-10-03: 20 mL

## 2015-10-03 MED ORDER — HYDROMORPHONE HCL 1 MG/ML IJ SOLN
1.0000 mg | INTRAMUSCULAR | Status: DC | PRN
  Administered 2015-10-03 – 2015-10-04 (×8): 1 mg via INTRAVENOUS
  Filled 2015-10-03 (×8): qty 1

## 2015-10-03 SURGICAL SUPPLY — 51 items
BLADE SURG 10 STRL SS (BLADE) ×3 IMPLANT
BLADE SURG 15 STRL LF DISP TIS (BLADE) ×1 IMPLANT
BLADE SURG 15 STRL SS (BLADE) ×2
BLADE SURG ROTATE 9660 (MISCELLANEOUS) IMPLANT
CANISTER SUCTION 2500CC (MISCELLANEOUS) IMPLANT
CHLORAPREP W/TINT 26ML (MISCELLANEOUS) ×3 IMPLANT
COVER SURGICAL LIGHT HANDLE (MISCELLANEOUS) ×3 IMPLANT
DECANTER SPIKE VIAL GLASS SM (MISCELLANEOUS) ×3 IMPLANT
DRAIN PENROSE 1/2X12 LTX STRL (WOUND CARE) ×3 IMPLANT
DRAPE LAPAROTOMY TRNSV 102X78 (DRAPE) ×3 IMPLANT
DRAPE UTILITY XL STRL (DRAPES) ×3 IMPLANT
ELECT CAUTERY BLADE 6.4 (BLADE) ×3 IMPLANT
ELECT REM PT RETURN 9FT ADLT (ELECTROSURGICAL) ×3
ELECTRODE REM PT RTRN 9FT ADLT (ELECTROSURGICAL) ×1 IMPLANT
GAUZE SPONGE 4X4 12PLY STRL (GAUZE/BANDAGES/DRESSINGS) ×3 IMPLANT
GLOVE BIO SURGEON STRL SZ 6.5 (GLOVE) ×2 IMPLANT
GLOVE BIO SURGEON STRL SZ8 (GLOVE) ×3 IMPLANT
GLOVE BIO SURGEONS STRL SZ 6.5 (GLOVE) ×1
GLOVE BIOGEL PI IND STRL 8 (GLOVE) ×2 IMPLANT
GLOVE BIOGEL PI INDICATOR 8 (GLOVE) ×4
GLOVE SURG SS PI 8.0 STRL IVOR (GLOVE) ×3 IMPLANT
GOWN STRL REUS W/ TWL LRG LVL3 (GOWN DISPOSABLE) IMPLANT
GOWN STRL REUS W/ TWL XL LVL3 (GOWN DISPOSABLE) ×2 IMPLANT
GOWN STRL REUS W/TWL LRG LVL3 (GOWN DISPOSABLE)
GOWN STRL REUS W/TWL XL LVL3 (GOWN DISPOSABLE) ×4
KIT BASIN OR (CUSTOM PROCEDURE TRAY) ×3 IMPLANT
KIT ROOM TURNOVER OR (KITS) ×3 IMPLANT
LIQUID BAND (GAUZE/BANDAGES/DRESSINGS) ×3 IMPLANT
MESH HERNIA SYS ULTRAPRO LRG (Mesh General) ×3 IMPLANT
NEEDLE HYPO 25GX1X1/2 BEV (NEEDLE) ×3 IMPLANT
NS IRRIG 1000ML POUR BTL (IV SOLUTION) ×3 IMPLANT
PACK SURGICAL SETUP 50X90 (CUSTOM PROCEDURE TRAY) ×3 IMPLANT
PAD ARMBOARD 7.5X6 YLW CONV (MISCELLANEOUS) ×3 IMPLANT
PENCIL BUTTON HOLSTER BLD 10FT (ELECTRODE) ×3 IMPLANT
SPONGE LAP 18X18 X RAY DECT (DISPOSABLE) ×3 IMPLANT
SUT MNCRL AB 4-0 PS2 18 (SUTURE) ×3 IMPLANT
SUT NOVA NAB DX-16 0-1 5-0 T12 (SUTURE) ×9 IMPLANT
SUT SILK 2 0 SH (SUTURE) IMPLANT
SUT VIC AB 0 CT1 27 (SUTURE)
SUT VIC AB 0 CT1 27XBRD ANBCTR (SUTURE) IMPLANT
SUT VIC AB 2-0 SH 27 (SUTURE) ×2
SUT VIC AB 2-0 SH 27X BRD (SUTURE) ×1 IMPLANT
SUT VIC AB 3-0 SH 18 (SUTURE) ×3 IMPLANT
SUT VICRYL AB 3 0 TIES (SUTURE) ×3 IMPLANT
SYR BULB 3OZ (MISCELLANEOUS) IMPLANT
SYR CONTROL 10ML LL (SYRINGE) ×3 IMPLANT
TOWEL OR 17X24 6PK STRL BLUE (TOWEL DISPOSABLE) ×3 IMPLANT
TOWEL OR 17X26 10 PK STRL BLUE (TOWEL DISPOSABLE) ×3 IMPLANT
TUBE CONNECTING 12'X1/4 (SUCTIONS)
TUBE CONNECTING 12X1/4 (SUCTIONS) IMPLANT
YANKAUER SUCT BULB TIP NO VENT (SUCTIONS) IMPLANT

## 2015-10-03 NOTE — ED Notes (Signed)
Pt. Placed on 4L oxygen prior to benzo admin. Surgery at bedside placing orders at this time.

## 2015-10-03 NOTE — ED Notes (Signed)
Patient reports that he awoke with severe rLQ Ppain. On arrival restless and unable to sit in  Chair. History of inguinal hernia. Tender to touch

## 2015-10-03 NOTE — Op Note (Signed)
NAME:  Jeremy Singh, MAYSONET NO.:  [id number]  MEDICAL RECORD NO.:  [id number]  LOCATION:  [facility]                         FACILITY:  [place]  PHYSICIAN:  Marcello Moores A. Delfina Schreurs, M.D.DATE OF BIRTH:  1970/10/01  DATE OF PROCEDURE:  10/03/2015 DATE OF DISCHARGE:                              OPERATIVE REPORT   PREOPERATIVE DIAGNOSIS:  Large right incarcerated inguinoscrotal hernia.  POSTOPERATIVE DIAGNOSIS:  Reducible large right inguinoscrotal hernia.  PROCEDURE:  Repair of right inguinal hernia with mesh.  SURGEON:  Marcello Moores A. Alayja Armas, M.D.  ANESTHESIA:  General endotracheal anesthesia.  EBL:  Minimal.  SPECIMENS:  None.  DRAINS:  None.  INDICATIONS FOR PROCEDURE:  The patient is a 45 year old male with a long-standing right inguinal hernia.  He has not sought medical care in the past, but woke up this morning at 5 o'clock with severe pain in his right groin and right hemiscrotum as well as significant swelling.  In the past, the hernia has been reducible, but this morning, it was not. He sought care in the emergency room immediately.  He was seen by the EDP where workup included an examination, blood work and a CT scan, which showed a large right inguinoscrotal hernia with a significant segment of small bowel within the right hemiscrotum.  This did not appear strangulated.  By examination, it was too uncomfortable to examine.  We brought up to the operating room emergently for emergent repair of his right inguinal hernia.  Discussed risk of the procedure to include, but not exclusive of, bleeding, infection, bowel injury, injury to the spermatic cord, testicle loss, injury to the bladder, injury to the iliac vessels, injury to the nerves or in the area, numbness from that, the need for recurrent surgery if his hernia repair fails, the use of mesh, risk of chronic pain to be on the scale of 1-2%, anesthesia risk, DVT, death, and the need for other treatments and/or  procedures. He agreed to proceed.  DESCRIPTION OF PROCEDURE:  The patient was met in the holding area after being seen in the emergency room.  The right groin was marked as the correct site.  It was much more comfortable.  Questions were answered with him and his wife.  The procedure was outlined as well as the use of mesh and alternatives to surgery.  They wished to proceed.  He was brought back to the operating room and placed supine.  After induction of general anesthesia, the right lower abdominal wall, scrotum and penis were prepped and draped in sterile fashion.  Time-out was done.  He received 2 g of Ancef.  His hernia was already reduced prior to prepping and draping, and this appeared to have happened spontaneously between the holding area and probably with the onset of induction.  A transverse right groin incision was made.  Dissection was carried through Scarpa's fascia and the superficial epigastric vessels were ligated.  Dissection was carried down to the aponeurosis of the external oblique.  This was opened in the direction of its fibers and the local anesthesia was infiltrated underneath this.  There was a large inguinoscrotal hernia, but there was nothing within the sac.  This was an indirect sac.  This was dissected off the cord, which took about an hour due to the significant adhesions of the hernia sac to the cord structures.  Care was taken not to injure the cord structures.  Once the sac was fully dissected off, it was examined and it was empty.  I reduced this back in the precarinal space without difficulty.  A large UltraPro hernia system was brought on the field.  The inner-leaf that was placed and deployed in the preperitoneal space and the [place] was placed onto the floor of the inguinal canal.  A slit was cut for the cord structures.  This was secured to the pubic tubercle, shelving edge of the inguinal ligament and to the internal oblique muscle with 2-0  Novafil.  Care was taken to avoid the iliohypogastric nerves and the ilioinguinal was sacrificed because it was trapped under the mesh.  This was retracted back into the muscle belly.  This was closed circumferentially.  There was ample room for the cord structures, which were quite large.  The tip of my 5th digit fit around the cord structures.  Hemostasis was achieved.  The testicle was returned back to the scrotal sac.  It was not twisted.  We then closed the aponeurosis of the external oblique with 2-0 Vicryl.  A 3-0 Vicryl was used to approximate the Scarpa's fascia.  A 4-0 Monocryl was used to close the skin in a subcuticular fashion.  Liquid adhesive applied.  All final counts were found to be correct.  The patient was awoke, extubated, taken to recovery in satisfactory condition.  All final counts were correct.     Vivia Rosenburg A. Tonnette Zwiebel, M.D.     TAC/MEDQ  D:  10/03/2015  T:  10/03/2015  Job:  [id number]

## 2015-10-03 NOTE — ED Notes (Signed)
Gave 5mg  of 10mg  valium dosage. Pt. Fell asleep. Oxygen saturations stable at this time. RN holding other half of dose. Pt. Comfortable at this time.

## 2015-10-03 NOTE — Transfer of Care (Signed)
Immediate Anesthesia Transfer of Care Note  Patient: Jeremy Singh  Procedure(s) Performed: Procedure(s): RIGHT HERNIA REPAIR INGUINAL INCARCERATED  (Right) INSERTION OF MESH (Right)  Patient Location: PACU  Anesthesia Type:General  Level of Consciousness: sedated  Airway & Oxygen Therapy: Patient Spontanous Breathing and Patient connected to face mask oxygen  Post-op Assessment: Report given to RN and Post -op Vital signs reviewed and stable  Post vital signs: Reviewed and stable  Last Vitals:  Filed Vitals:   10/03/15 0801 10/03/15 0902  BP: 161/91 163/90  Pulse: 92 108  Temp:    Resp:  30    Complications: No apparent anesthesia complications

## 2015-10-03 NOTE — Care Management Note (Signed)
Case Management Note  Patient Details  Name: Salvadore DomDavid L Fiallos MRN: [id number] Date of Birth: 12/10/1970  Subjective/Objective:                    Action/Plan:  Initial UR completed  Expected Discharge Date:                  Expected Discharge Plan:  Home/Self Care  In-House Referral:     Discharge planning Services     Post Acute Care Choice:    Choice offered to:     DME Arranged:    DME Agency:     HH Arranged:    HH Agency:     Status of Service:  In process, will continue to follow  Medicare Important Message Given:    Date Medicare IM Given:    Medicare IM give by:    Date Additional Medicare IM Given:    Additional Medicare Important Message give by:     If discussed at Long Length of Stay Meetings, dates discussed:    Additional Comments:  Kingsley PlanWile, Deah Ottaway Marie, RN 10/03/2015, 3:31 PM

## 2015-10-03 NOTE — Brief Op Note (Signed)
10/03/2015  12:54 PM  PATIENT:  Salvadore Domavid L Homeyer  45 y.o. male  PRE-OPERATIVE DIAGNOSIS:  right inguinal  hernia   POST-OPERATIVE DIAGNOSIS:  right inguinal  hernia   PROCEDURE:  Repair of large right inguinoscrotal hernia with mesh   SURGEON:  Surgeon(s) and Role:    * Harriette Bouillonhomas Dillian Feig, MD - Primary    ASSISTANTS: none   ANESTHESIA:   local and general  EBL:     BLOOD ADMINISTERED:none  DRAINS: none   LOCAL MEDICATIONS USED:  BUPIVICAINE   SPECIMEN:  No Specimen  DISPOSITION OF SPECIMEN:  N/A  COUNTS:  YES  TOURNIQUET:  * No tourniquets in log *  DICTATION: .Other Dictation: Dictation Number [id number]  PLAN OF CARE: Admit to inpatient   PATIENT DISPOSITION:  PACU - hemodynamically stable.   Delay start of Pharmacological VTE agent (>24hrs) due to surgical blood loss or risk of bleeding: no

## 2015-10-03 NOTE — H&P (Signed)
[facility] Admission Note  Jeremy Singh August 24, 1970  [id number].    Requesting MD: Dr. Johnney Killian Chief Complaint/Reason for Consult: Incarcerated right inguinal hernia  HPI:  45 y/o healthy AA male with known large right inguinal hernia with significant bowel in scrotum was awoken this am around 5-5:30am with acute severe 10/10 abdominal, groin, and scrotal pain.  He has had the hernia for years, and he saw a Chemical engineer in 2015 who referred him for a CT scan done November 2015, but because he was asymptomatic from it he never had it repaired.  It enlarges on and off, but this time it wouldn't go back in.  Lying flat makes the pain worse.  He is writhing in pain trying to find a comfortable position.  He vomited several times.  He denies any diarrhea or constipation.  No fevers/chills, dysuria, CP/SOB.  Last BM was yesterday, last meal yesterday.  Denies heavy lifting cause it to come on.  No h/o abdominal surgeries or hospitalizations.    CT reviewed and shows SBO secondary to right incarcerated inguinal hernia containing omental fat and multiple fluid filled distended SB loops.  Hernia is large measuring 16cm x 10cm.  Mesenteric edema and vessel congestion noted centrally.  No fre air, no appendiceal inflammation, no hydronephrosis or hydroureter.    ROS: All systems reviewed and otherwise negative except for as above  Family History  Problem Relation Age of Onset  . Cancer Mother   . Cancer Father     Past Medical History  Diagnosis Date  . Abscess of back     multiple other sites    History reviewed. No pertinent past surgical history.  Social History:  reports that he has never smoked. He does not have any smokeless tobacco history on file. He reports that he drinks alcohol. He reports that he does not use illicit drugs.  Allergies: No Known Allergies   (Not in a hospital admission)  Blood pressure 161/91, pulse 92, temperature 97.8 F (36.6 C),  resp. rate 21, height '5\' 6"'$  (1.676 m), weight 160 lb (72.576 kg), SpO2 97 %. Physical Exam: General: Writhing in pain in ED, WD/WN AA male who is laying in bed in acute distress due to pain HEENT: head is normocephalic, atraumatic.  Sclera are noninjected.  PERRL.  Ears and nose without any masses or lesions.  Mouth is pink and moist Heart: regular, rate, and rhythm.  No obvious murmurs, gallops, or rubs noted.  Palpable pedal pulses bilaterally Lungs: CTAB, no wheezes, rhonchi, or rales noted.  Respiratory effort nonlabored Abd: tense, distended, tender throughout, very tender in the RLQ, groin and right scrotum, Very large scrotal mass (hernia), diminished BS, no organomegaly. MS: all 4 extremities are symmetrical with no cyanosis, clubbing, or edema. Skin: warm and dry with no masses, lesions, or rashes Psych: A&Ox3 with an appropriate affect.   Results for orders placed or performed during the hospital encounter of 10/03/15 (from the past 48 hour(s))  Comprehensive metabolic panel     Status: Abnormal   Collection Time: 10/03/15  7:44 AM  Result Value Ref Range   Sodium 141 135 - 145 mmol/L   Potassium 2.9 (L) 3.5 - 5.1 mmol/L   Chloride 108 101 - 111 mmol/L   CO2 19 (L) 22 - 32 mmol/L   Glucose, Bld 183 (H) 65 - 99 mg/dL   BUN 13 6 - 20 mg/dL   Creatinine, Ser 1.16 0.61 - 1.24 mg/dL   Calcium 8.8 (  L) 8.9 - 10.3 mg/dL   Total Protein 6.8 6.5 - 8.1 g/dL   Albumin 3.7 3.5 - 5.0 g/dL   AST 25 15 - 41 U/L   ALT 20 17 - 63 U/L   Alkaline Phosphatase 51 38 - 126 U/L   Total Bilirubin 1.1 0.3 - 1.2 mg/dL   GFR calc non Af Amer >60 >60 mL/min   GFR calc Af Amer >60 >60 mL/min    Comment: (NOTE) The eGFR has been calculated using the CKD EPI equation. This calculation has not been validated in all clinical situations. eGFR's persistently <60 mL/min signify possible Chronic Kidney Disease.    Anion gap 14 5 - 15  CBC with Differential     Status: None   Collection Time: 10/03/15   7:44 AM  Result Value Ref Range   WBC 6.4 4.0 - 10.5 K/uL   RBC 4.46 4.22 - 5.81 MIL/uL   Hemoglobin 13.8 13.0 - 17.0 g/dL   HCT 41.5 39.0 - 52.0 %   MCV 93.0 78.0 - 100.0 fL   MCH 30.9 26.0 - 34.0 pg   MCHC 33.3 30.0 - 36.0 g/dL   RDW 12.4 11.5 - 15.5 %   Platelets 252 150 - 400 K/uL   Neutrophils Relative % 53 %   Neutro Abs 3.4 1.7 - 7.7 K/uL   Lymphocytes Relative 37 %   Lymphs Abs 2.4 0.7 - 4.0 K/uL   Monocytes Relative 8 %   Monocytes Absolute 0.5 0.1 - 1.0 K/uL   Eosinophils Relative 2 %   Eosinophils Absolute 0.1 0.0 - 0.7 K/uL   Basophils Relative 0 %   Basophils Absolute 0.0 0.0 - 0.1 K/uL  Protime-INR     Status: None   Collection Time: 10/03/15  7:44 AM  Result Value Ref Range   Prothrombin Time 13.4 11.6 - 15.2 seconds   INR 1.00 0.00 - 1.49  Type and screen [place]     Status: None   Collection Time: 10/03/15  7:54 AM  Result Value Ref Range   ABO/RH(D) A POS    Antibody Screen NEG    Sample Expiration 10/06/2015    Ct Abdomen Pelvis W Contrast  10/03/2015  CLINICAL DATA:  Incarcerated hernia EXAM: CT ABDOMEN AND PELVIS WITH CONTRAST TECHNIQUE: Multidetector CT imaging of the abdomen and pelvis was performed using the standard protocol following bolus administration of intravenous contrast. CONTRAST:  1 ISOVUE-300 IOPAMIDOL (ISOVUE-300) INJECTION 61% COMPARISON:  12/10/2008 FINDINGS: Lower chest: The lung bases are unremarkable. Breathing motion artifacts are noted. Hepatobiliary: Enhanced liver is unremarkable. No calcified gallstones are noted within gallbladder. Pancreas: No mass, inflammatory changes, or other significant abnormality. Spleen: Within normal limits in size and appearance. Adrenals/Urinary Tract: No adrenal gland mass. Enhanced kidneys are symmetrical in size. No hydronephrosis or hydroureter. Delayed renal images shows bilateral renal symmetrical excretion. Bilateral visualized proximal ureter is unremarkable. Mild distorted  right anterior urinary bladder by a mass effect from right inguinal hernia. Stomach/Bowel: No gastric outlet obstruction. Mild distended small bowel loops with fluid in right lower quadrant. There is mesenteric edema and tiny amount of mesenteric fluid in right lower pelvis and right inguinal region. There is a large right inguinal scrotal canal hernia containing fluid distended small bowel loops and congested vessels. Tiny central mesenteric edema. There is transition point in caliber of small bowel in a loop entering the hernia best seen in axial image 70. Findings are consistent with small bowel obstruction due to incarcerated  right inguinal scrotal canal hernia. The hernia and is best visualized in coronal image 42 measures 16 cm cranial caudally by 10 cm transverse diameter. Small amount of fluid noted in dependent portion of the hernia. There is thickening of dependent scrotal wall. No pericecal inflammation. The appendix is not clearly identified. Vascular/Lymphatic: There is no aortic aneurysm. No retroperitoneal or mesenteric adenopathy. Reproductive: Prostate gland and seminal vesicles are unremarkable. There is mass effect on the penile base with left lateral deviation from door large right inguinal scrotal hernia. Other: There is a small left inguinal canal hernia containing fluid measures 2.3 cm. No free abdominal air. No pelvic free air. Musculoskeletal: Sagittal images of the spine shows mild degenerative changes lower thoracic spine. There is Schmorl's node deformity lower endplate of L1 vertebral body. IMPRESSION: 1. Findings consistent with small bowel obstruction due to incarcerated right inguinal scrotal hernia containing omental fat and multiple fluid distended small bowel loops. There is small amount of fluid within dependent aspect of the hernia. Hernia measures at least 16 x 10 cm. There is also mesenteric edema and engorged mesenteric vessels in right lower anterior pelvis/inguinal region.  Mesenteric edema and congested vessels are noted centrally within hernia. 2. No pericecal inflammation. The appendix is not clearly identified. 3. No free abdominal air. 4. No hydronephrosis or hydroureter. These results were called by telephone at the time of interpretation on 10/03/2015 at 9:01 am to Dr. Charlesetta Shanks , who verbally acknowledged these results. Electronically Signed   By: Lahoma Crocker M.D.   On: 10/03/2015 09:03      Assessment/Plan Small bowel obstruction 2* a large (16x10cm) incarcerated right inguinal hernia  Plan: 1.  Admit to [facility], urgent open hernia repair, straight from ER to pre-op holding 2.  NPO, bowel rest, IVF, pain control, add valium, antiemetics, antibiotics (ancef pre-op)    Nat Christen, [facility] 10/03/2015, 9:06 AM Pager: [phone] (7am - 4:30pm M-F; 7am - 11:30am Sa/Su)

## 2015-10-03 NOTE — Progress Notes (Deleted)
[facility] Admission Note  Jeremy Singh [birth year]  [id number].    Requesting MD: Dr. Johnney Killian Chief Complaint/Reason for Consult: Incarcerated right inguinal hernia  HPI:  45 y/o healthy AA male with known large right inguinal hernia with significant bowel in scrotum was awoken this am around 5-5:30am with acute severe 10/10 abdominal, groin, and scrotal pain.  He has had the hernia for years, and he saw a Chemical engineer in 2015 who referred him for a CT scan done November 2015, but because he was asymptomatic from it he never had it repaired.  It enlarges on and off, but this time it wouldn't go back in.  Lying flat makes the pain worse.  He is writhing in pain trying to find a comfortable position.  He vomited several times.  He denies any diarrhea or constipation.  No fevers/chills, dysuria, CP/SOB.  Last BM was yesterday, last meal yesterday.  Denies heavy lifting cause it to come on.  No h/o abdominal surgeries or hospitalizations.    CT reviewed and shows SBO secondary to right incarcerated inguinal hernia containing omental fat and multiple fluid filled distended SB loops.  Hernia is large measuring 16cm x 10cm.  Mesenteric edema and vessel congestion noted centrally.  No fre air, no appendiceal inflammation, no hydronephrosis or hydroureter.    ROS: All systems reviewed and otherwise negative except for as above  Family History  Problem Relation Age of Onset  . Cancer Mother   . Cancer Father     Past Medical History  Diagnosis Date  . Abscess of back     multiple other sites    History reviewed. No pertinent past surgical history.  Social History:  reports that he has never smoked. He does not have any smokeless tobacco history on file. He reports that he drinks alcohol. He reports that he does not use illicit drugs.  Allergies: No Known Allergies   (Not in a hospital admission)  Blood pressure 161/91, pulse 92, temperature 97.8 F (36.6 C),  resp. rate 21, height '5\' 6"'$  (1.676 m), weight 160 lb (72.576 kg), SpO2 97 %. Physical Exam: General: Writhing in pain in ED, WD/WN AA male who is laying in bed in acute distress due to pain HEENT: head is normocephalic, atraumatic.  Sclera are noninjected.  PERRL.  Ears and nose without any masses or lesions.  Mouth is pink and moist Heart: regular, rate, and rhythm.  No obvious murmurs, gallops, or rubs noted.  Palpable pedal pulses bilaterally Lungs: CTAB, no wheezes, rhonchi, or rales noted.  Respiratory effort nonlabored Abd: tense, distended, tender throughout, very tender in the RLQ, groin and right scrotum, Very large scrotal mass (hernia), diminished BS, no organomegaly. MS: all 4 extremities are symmetrical with no cyanosis, clubbing, or edema. Skin: warm and dry with no masses, lesions, or rashes Psych: A&Ox3 with an appropriate affect.   Results for orders placed or performed during the hospital encounter of 10/03/15 (from the past 48 hour(s))  Comprehensive metabolic panel     Status: Abnormal   Collection Time: 10/03/15  7:44 AM  Result Value Ref Range   Sodium 141 135 - 145 mmol/L   Potassium 2.9 (L) 3.5 - 5.1 mmol/L   Chloride 108 101 - 111 mmol/L   CO2 19 (L) 22 - 32 mmol/L   Glucose, Bld 183 (H) 65 - 99 mg/dL   BUN 13 6 - 20 mg/dL   Creatinine, Ser 1.16 0.61 - 1.24 mg/dL   Calcium 8.8 (  L) 8.9 - 10.3 mg/dL   Total Protein 6.8 6.5 - 8.1 g/dL   Albumin 3.7 3.5 - 5.0 g/dL   AST 25 15 - 41 U/L   ALT 20 17 - 63 U/L   Alkaline Phosphatase 51 38 - 126 U/L   Total Bilirubin 1.1 0.3 - 1.2 mg/dL   GFR calc non Af Amer >60 >60 mL/min   GFR calc Af Amer >60 >60 mL/min    Comment: (NOTE) The eGFR has been calculated using the CKD EPI equation. This calculation has not been validated in all clinical situations. eGFR's persistently <60 mL/min signify possible Chronic Kidney Disease.    Anion gap 14 5 - 15  CBC with Differential     Status: None   Collection Time: 10/03/15   7:44 AM  Result Value Ref Range   WBC 6.4 4.0 - 10.5 K/uL   RBC 4.46 4.22 - 5.81 MIL/uL   Hemoglobin 13.8 13.0 - 17.0 g/dL   HCT 27.0 62.3 - 76.2 %   MCV 93.0 78.0 - 100.0 fL   MCH 30.9 26.0 - 34.0 pg   MCHC 33.3 30.0 - 36.0 g/dL   RDW 83.1 51.7 - 61.6 %   Platelets 252 150 - 400 K/uL   Neutrophils Relative % 53 %   Neutro Abs 3.4 1.7 - 7.7 K/uL   Lymphocytes Relative 37 %   Lymphs Abs 2.4 0.7 - 4.0 K/uL   Monocytes Relative 8 %   Monocytes Absolute 0.5 0.1 - 1.0 K/uL   Eosinophils Relative 2 %   Eosinophils Absolute 0.1 0.0 - 0.7 K/uL   Basophils Relative 0 %   Basophils Absolute 0.0 0.0 - 0.1 K/uL  Protime-INR     Status: None   Collection Time: 10/03/15  7:44 AM  Result Value Ref Range   Prothrombin Time 13.4 11.6 - 15.2 seconds   INR 1.00 0.00 - 1.49  Type and screen [facility]     Status: None   Collection Time: 10/03/15  7:54 AM  Result Value Ref Range   ABO/RH(D) A POS    Antibody Screen NEG    Sample Expiration 10/06/2015    Ct Abdomen Pelvis W Contrast  10/03/2015  CLINICAL DATA:  Incarcerated hernia EXAM: CT ABDOMEN AND PELVIS WITH CONTRAST TECHNIQUE: Multidetector CT imaging of the abdomen and pelvis was performed using the standard protocol following bolus administration of intravenous contrast. CONTRAST:  1 ISOVUE-300 IOPAMIDOL (ISOVUE-300) INJECTION 61% COMPARISON:  12/10/2008 FINDINGS: Lower chest: The lung bases are unremarkable. Breathing motion artifacts are noted. Hepatobiliary: Enhanced liver is unremarkable. No calcified gallstones are noted within gallbladder. Pancreas: No mass, inflammatory changes, or other significant abnormality. Spleen: Within normal limits in size and appearance. Adrenals/Urinary Tract: No adrenal gland mass. Enhanced kidneys are symmetrical in size. No hydronephrosis or hydroureter. Delayed renal images shows bilateral renal symmetrical excretion. Bilateral visualized proximal ureter is unremarkable. Mild distorted  right anterior urinary bladder by a mass effect from right inguinal hernia. Stomach/Bowel: No gastric outlet obstruction. Mild distended small bowel loops with fluid in right lower quadrant. There is mesenteric edema and tiny amount of mesenteric fluid in right lower pelvis and right inguinal region. There is a large right inguinal scrotal canal hernia containing fluid distended small bowel loops and congested vessels. Tiny central mesenteric edema. There is transition point in caliber of small bowel in a loop entering the hernia best seen in axial image 70. Findings are consistent with small bowel obstruction due to incarcerated  right inguinal scrotal canal hernia. The hernia and is best visualized in coronal image 42 measures 16 cm cranial caudally by 10 cm transverse diameter. Small amount of fluid noted in dependent portion of the hernia. There is thickening of dependent scrotal wall. No pericecal inflammation. The appendix is not clearly identified. Vascular/Lymphatic: There is no aortic aneurysm. No retroperitoneal or mesenteric adenopathy. Reproductive: Prostate gland and seminal vesicles are unremarkable. There is mass effect on the penile base with left lateral deviation from door large right inguinal scrotal hernia. Other: There is a small left inguinal canal hernia containing fluid measures 2.3 cm. No free abdominal air. No pelvic free air. Musculoskeletal: Sagittal images of the spine shows mild degenerative changes lower thoracic spine. There is Schmorl's node deformity lower endplate of L1 vertebral body. IMPRESSION: 1. Findings consistent with small bowel obstruction due to incarcerated right inguinal scrotal hernia containing omental fat and multiple fluid distended small bowel loops. There is small amount of fluid within dependent aspect of the hernia. Hernia measures at least 16 x 10 cm. There is also mesenteric edema and engorged mesenteric vessels in right lower anterior pelvis/inguinal region.  Mesenteric edema and congested vessels are noted centrally within hernia. 2. No pericecal inflammation. The appendix is not clearly identified. 3. No free abdominal air. 4. No hydronephrosis or hydroureter. These results were called by telephone at the time of interpretation on 10/03/2015 at 9:01 am to Dr. Charlesetta Shanks , who verbally acknowledged these results. Electronically Signed   By: Lahoma Crocker M.D.   On: 10/03/2015 09:03      Assessment/Plan Small bowel obstruction 2* a large (16x10cm) incarcerated right inguinal hernia  Plan: 1.  Admit to [facility], urgent open hernia repair, straight from ER to pre-op holding 2.  NPO, bowel rest, IVF, pain control, add valium, antiemetics, antibiotics (ancef pre-op)    Nat Christen, [facility] 10/03/2015, 9:06 AM Pager: [phone] (7am - 4:30pm M-F; 7am - 11:30am Sa/Su)

## 2015-10-03 NOTE — Anesthesia Procedure Notes (Signed)
Procedure Name: Intubation Date/Time: 10/03/2015 11:14 AM Performed by: Quentin OreWALKER, Tara Rud E Pre-anesthesia Checklist: Patient identified, Emergency Drugs available, Suction available, Patient being monitored and Timeout performed Patient Re-evaluated:Patient Re-evaluated prior to inductionOxygen Delivery Method: Circle system utilized Preoxygenation: Pre-oxygenation with 100% oxygen Intubation Type: IV induction Ventilation: Mask ventilation without difficulty Laryngoscope Size: Mac and 4 Grade View: Grade II Tube type: Oral Tube size: 7.5 mm Number of attempts: 1 Airway Equipment and Method: Stylet Placement Confirmation: ETT inserted through vocal cords under direct vision,  positive ETCO2 and breath sounds checked- equal and bilateral Secured at: 21 cm Tube secured with: Tape Dental Injury: Teeth and Oropharynx as per pre-operative assessment

## 2015-10-03 NOTE — Anesthesia Preprocedure Evaluation (Addendum)
Anesthesia Evaluation   Patient awake    Reviewed: Allergy & Precautions, NPO status , Patient's Chart, lab work & pertinent test results  Airway Mallampati: III  TM Distance: >3 FB Neck ROM: Full    Dental  (+) Teeth Intact   Pulmonary neg pulmonary ROS,    breath sounds clear to auscultation       Cardiovascular negative cardio ROS   Rhythm:Regular Rate:Normal     Neuro/Psych negative neurological ROS  negative psych ROS   GI/Hepatic negative GI ROS, Neg liver ROS,   Endo/Other  negative endocrine ROS  Renal/GU negative Renal ROS  negative genitourinary   Musculoskeletal negative musculoskeletal ROS (+)   Abdominal   Peds negative pediatric ROS (+)  Hematology negative hematology ROS (+)   Anesthesia Other Findings   Reproductive/Obstetrics negative OB ROS                            Lab Results  Component Value Date   WBC 6.4 10/03/2015   HGB 13.8 10/03/2015   HCT 41.5 10/03/2015   MCV 93.0 10/03/2015   PLT 252 10/03/2015   Lab Results  Component Value Date   CREATININE 1.16 10/03/2015   BUN 13 10/03/2015   NA 141 10/03/2015   K 2.9* 10/03/2015   CL 108 10/03/2015   CO2 19* 10/03/2015   Lab Results  Component Value Date   INR 1.00 10/03/2015     Anesthesia Physical Anesthesia Plan  ASA: II  Anesthesia Plan: General   Post-op Pain Management:    Induction: Intravenous  Airway Management Planned: Oral ETT  Additional Equipment:   Intra-op Plan:   Post-operative Plan: Extubation in OR  Informed Consent: I have reviewed the patients History and Physical, chart, labs and discussed the procedure including the risks, benefits and alternatives for the proposed anesthesia with the patient or authorized representative who has indicated his/her understanding and acceptance.   Dental advisory given  Plan Discussed with: CRNA  Anesthesia Plan Comments: ([name] is somnolent but arousable likely 2/2 pain medications, Mr. Jeremy Singh and wife were consented for surgery.  Anesthetic plan discussed in detail. Associated risk discussed including but not limited to life threatening cardiovascular, pulmonary events and dental damage. The postoperative pain management and antiemetic plan discussed with patient. All questions answered in detail. Patient is in agreement.   )       Anesthesia Quick Evaluation

## 2015-10-03 NOTE — ED Provider Notes (Signed)
CSN: [id number]     Arrival date & time 10/03/15  0706 History   First MD Initiated Contact with Patient 10/03/15 0725     Chief Complaint  Patient presents with  . inguinal hernia pain      (Consider location/radiation/quality/duration/timing/severity/associated sxs/prior Treatment) HPI Patient reports he has a known right inguinal hernia. He states typically it is not painful. He states typically it falls down into his scrotum in the morning when he goes to work and that is the most comfortable position. This morning (05:30) he awakened with severe pain and reports the hernia won't move. Pain is in his scrotum and lower abdomen. Severe aching cramping pain. 3 episodes vomiting, no diarrhea or constipation. He went to bed well and has not had a similar pain episode.  [facility]. He had a CT scan done November 2015. This showed large right inguinal hernia without obstruction. Patient and his wife report this is the last time they had any medical care regarding the hernia. Patient states since it did not cause him pain he did not opt to have it repaired.  Past Medical History  Diagnosis Date  . Abscess of back     multiple other sites   History reviewed. No pertinent past surgical history. Family History  Problem Relation Age of Onset  . Cancer Mother   . Cancer Father    Social History  Substance Use Topics  . Smoking status: Never Smoker   . Smokeless tobacco: None  . Alcohol Use: Yes     Comment: occasionally    Review of Systems  10 Systems reviewed and are negative for acute change except as noted in the HPI.  Allergies  Review of patient's allergies indicates no known allergies.  Home Medications   Prior to Admission medications   Not on File   BP 161/91 mmHg  Pulse 92  Temp(Src) 97.8 F (36.6 C)  Resp 21  Ht 5\' 6"  (1.676 m)  Wt 160 lb (72.576 kg)  BMI 25.84 kg/m2  SpO2 97% Physical Exam  Constitutional: He is oriented to  person, place, and time. He appears well-developed and well-nourished. He appears distressed.  Patient is in severe pain. No respiratory distress.  HENT:  Head: Normocephalic and atraumatic.  Eyes: EOM are normal. Pupils are equal, round, and reactive to light.  Neck: Neck supple.  Cardiovascular: Normal rate, regular rhythm, normal heart sounds and intact distal pulses.   Pulmonary/Chest: Effort normal and breath sounds normal.  Abdominal: Soft. Bowel sounds are normal. He exhibits no distension. There is tenderness.  Low abdomen tender to palpation. No mass.  Genitourinary:  Very large swelling of scrotum which is firm and very tender. Long scrotal axis approximately 20 cm, symmetric but very firm swelling to the right side. Testicle is not palpable due to firmness of scrotum. Penis is normal without edema.  Musculoskeletal: Normal range of motion. He exhibits no edema or tenderness.  Neurological: He is alert and oriented to person, place, and time. He has normal strength. Coordination normal. GCS eye subscore is 4. GCS verbal subscore is 5. GCS motor subscore is 6.  Skin: Skin is warm, dry and intact.     ED Course  Procedures (including critical care time) CRITICAL CARE Performed by: Arby BarrettePfeiffer, Meily Glowacki   Total critical care time: 30 minutes  Critical care time was exclusive of separately billable procedures and treating other patients.  Critical care was necessary to treat or prevent imminent or  life-threatening deterioration.  Critical care was time spent personally by me on the following activities: development of treatment plan with patient and/or surrogate as well as nursing, discussions with consultants, evaluation of patient's response to treatment, examination of patient, obtaining history from patient or surrogate, ordering and performing treatments and interventions, ordering and review of laboratory studies, ordering and review of radiographic studies, pulse oximetry and  re-evaluation of patient's condition. Labs Review Labs Reviewed  COMPREHENSIVE METABOLIC PANEL - Abnormal; Notable for the following:    Potassium 2.9 (*)    CO2 19 (*)    Glucose, Bld 183 (*)    Calcium 8.8 (*)    All other components within normal limits  CBC WITH DIFFERENTIAL/PLATELET  PROTIME-INR  TYPE AND SCREEN  ABO/RH    Imaging Review Ct Abdomen Pelvis W Contrast  10/03/2015  CLINICAL DATA:  Incarcerated hernia EXAM: CT ABDOMEN AND PELVIS WITH CONTRAST TECHNIQUE: Multidetector CT imaging of the abdomen and pelvis was performed using the standard protocol following bolus administration of intravenous contrast. CONTRAST:  1 ISOVUE-300 IOPAMIDOL (ISOVUE-300) INJECTION 61% COMPARISON:  12/10/2008 FINDINGS: Lower chest: The lung bases are unremarkable. Breathing motion artifacts are noted. Hepatobiliary: Enhanced liver is unremarkable. No calcified gallstones are noted within gallbladder. Pancreas: No mass, inflammatory changes, or other significant abnormality. Spleen: Within normal limits in size and appearance. Adrenals/Urinary Tract: No adrenal gland mass. Enhanced kidneys are symmetrical in size. No hydronephrosis or hydroureter. Delayed renal images shows bilateral renal symmetrical excretion. Bilateral visualized proximal ureter is unremarkable. Mild distorted right anterior urinary bladder by a mass effect from right inguinal hernia. Stomach/Bowel: No gastric outlet obstruction. Mild distended small bowel loops with fluid in right lower quadrant. There is mesenteric edema and tiny amount of mesenteric fluid in right lower pelvis and right inguinal region. There is a large right inguinal scrotal canal hernia containing fluid distended small bowel loops and congested vessels. Tiny central mesenteric edema. There is transition point in caliber of small bowel in a loop entering the hernia best seen in axial image 70. Findings are consistent with small bowel obstruction due to incarcerated  right inguinal scrotal canal hernia. The hernia and is best visualized in coronal image 42 measures 16 cm cranial caudally by 10 cm transverse diameter. Small amount of fluid noted in dependent portion of the hernia. There is thickening of dependent scrotal wall. No pericecal inflammation. The appendix is not clearly identified. Vascular/Lymphatic: There is no aortic aneurysm. No retroperitoneal or mesenteric adenopathy. Reproductive: Prostate gland and seminal vesicles are unremarkable. There is mass effect on the penile base with left lateral deviation from door large right inguinal scrotal hernia. Other: There is a small left inguinal canal hernia containing fluid measures 2.3 cm. No free abdominal air. No pelvic free air. Musculoskeletal: Sagittal images of the spine shows mild degenerative changes lower thoracic spine. There is Schmorl's node deformity lower endplate of L1 vertebral body. IMPRESSION: 1. Findings consistent with small bowel obstruction due to incarcerated right inguinal scrotal hernia containing omental fat and multiple fluid distended small bowel loops. There is small amount of fluid within dependent aspect of the hernia. Hernia measures at least 16 x 10 cm. There is also mesenteric edema and engorged mesenteric vessels in right lower anterior pelvis/inguinal region. Mesenteric edema and congested vessels are noted centrally within hernia. 2. No pericecal inflammation. The appendix is not clearly identified. 3. No free abdominal air. 4. No hydronephrosis or hydroureter. These results were called by telephone at the time of interpretation on  10/03/2015 at 9:01 am to Dr. Arby Barrette , who verbally acknowledged these results. Electronically Signed   By: Natasha Mead M.D.   On: 10/03/2015 09:03   I have personally reviewed and evaluated these images and lab results as part of my medical decision-making.   EKG Interpretation None     Consult:(08:39) Megan PA-C of general surgery has returned  page. She will come to the emergency department to evaluate the patient at this time. Advised the patient has incarcerated hernia and we have not been able to accomplish pain control despite high doses of dilaudid. MDM   Final diagnoses:  Incarcerated inguinal hernia   Patient presents with severe and acute onset of pain with known history of inguinal hernia. Presentation consistent with incarceration. Patient has severe pain and was not getting adequate pain control with high doses of narcotics. Surgery consulted for immediate management. Patient is otherwise healthy without significant comorbid illnesses.    Arby Barrette, MD 10/03/15 [phone]

## 2015-10-04 ENCOUNTER — Encounter: Payer: Self-pay | Admitting: General Surgery

## 2015-10-04 ENCOUNTER — Encounter (HOSPITAL_COMMUNITY): Payer: Self-pay | Admitting: Surgery

## 2015-10-04 LAB — CBC
HCT: 37.2 % — ABNORMAL LOW (ref 39.0–52.0)
Hemoglobin: 12.1 g/dL — ABNORMAL LOW (ref 13.0–17.0)
MCH: 30.3 pg (ref 26.0–34.0)
MCHC: 32.5 g/dL (ref 30.0–36.0)
MCV: 93 fL (ref 78.0–100.0)
PLATELETS: 234 10*3/uL (ref 150–400)
RBC: 4 MIL/uL — ABNORMAL LOW (ref 4.22–5.81)
RDW: 12.6 % (ref 11.5–15.5)
WBC: 14.5 10*3/uL — ABNORMAL HIGH (ref 4.0–10.5)

## 2015-10-04 LAB — BASIC METABOLIC PANEL
Anion gap: 9 (ref 5–15)
BUN: 12 mg/dL (ref 6–20)
CALCIUM: 8.5 mg/dL — AB (ref 8.9–10.3)
CO2: 25 mmol/L (ref 22–32)
CREATININE: 1.19 mg/dL (ref 0.61–1.24)
Chloride: 102 mmol/L (ref 101–111)
GFR calc Af Amer: >60 mL/min (ref >60)
GFR calc non Af Amer: >60 mL/min (ref >60)
GLUCOSE: 112 mg/dL — AB (ref 65–99)
Potassium: 4.3 mmol/L (ref 3.5–5.1)
Sodium: 136 mmol/L (ref 135–145)

## 2015-10-04 LAB — MRSA PCR SCREENING: MRSA BY PCR: NEGATIVE

## 2015-10-04 MED ORDER — KETOROLAC TROMETHAMINE 30 MG/ML IJ SOLN
30.0000 mg | Freq: Once | INTRAMUSCULAR | Status: AC
  Administered 2015-10-04: 30 mg via INTRAVENOUS
  Filled 2015-10-04: qty 1

## 2015-10-04 MED ORDER — HYDROMORPHONE HCL 1 MG/ML IJ SOLN: 1.0000 mg | INTRAMUSCULAR | Status: DC | PRN

## 2015-10-04 MED ORDER — ACETAMINOPHEN 500 MG PO TABS
1000.0000 mg | ORAL_TABLET | Freq: Four times a day (QID) | ORAL | Status: DC
  Administered 2015-10-04 (×2): 1000 mg via ORAL
  Filled 2015-10-04 (×2): qty 2

## 2015-10-04 MED ORDER — HEPARIN SODIUM (PORCINE) 5000 UNIT/ML IJ SOLN
5000.0000 [IU] | Freq: Three times a day (TID) | INTRAMUSCULAR | Status: DC
  Administered 2015-10-04: 5000 [IU] via SUBCUTANEOUS
  Filled 2015-10-04: qty 1

## 2015-10-04 MED ORDER — OXYCODONE HCL 5 MG PO TABS
10.0000 mg | ORAL_TABLET | ORAL | Status: DC | PRN
  Administered 2015-10-04: 20 mg via ORAL
  Filled 2015-10-04: qty 4

## 2015-10-04 MED ORDER — ACETAMINOPHEN 500 MG PO TABS: 1000.0000 mg | ORAL_TABLET | Freq: Four times a day (QID) | ORAL | Status: AC

## 2015-10-04 MED ORDER — KETOROLAC TROMETHAMINE 30 MG/ML IJ SOLN
30.0000 mg | Freq: Three times a day (TID) | INTRAMUSCULAR | Status: DC | PRN
  Administered 2015-10-04: 30 mg via INTRAVENOUS
  Filled 2015-10-04: qty 1

## 2015-10-04 MED ORDER — OXYCODONE HCL 10 MG PO TABS: 10.0000 mg | ORAL_TABLET | ORAL | Status: DC | PRN

## 2015-10-04 MED ORDER — IBUPROFEN 200 MG PO TABS: ORAL_TABLET | ORAL | Status: DC

## 2015-10-04 NOTE — Progress Notes (Signed)
Pt states he is getting good relief with p.o. meds . Discharge instructions given to pt and his mother. Discharged home in good condition.

## 2015-10-04 NOTE — Progress Notes (Signed)
Pt. Refused a bath at this moment. PT. Family stated Pt. Is in a lot of pain. I set him up with all the essentials for a bath. Pt. Stated he will likely get a bath after breakfast. Inform him if he needs any assistance to let the NT know and she will assist.

## 2015-10-04 NOTE — Progress Notes (Signed)
1 Day Post-Op  Subjective: Had pain and is very swollen this AM.  Scrotum is tight and full.  Incision looks fine.    Objective: Vital signs in last 24 hours: Temp:  [98 F (36.7 C)-100 F (37.8 C)] 98.6 F (37 C) (04/25 0520) Pulse Rate:  [72-108] 72 (04/25 0520) Resp:  [13-30] 18 (04/25 0520) BP: (131-163)/(59-91) 132/68 mmHg (04/25 0520) SpO2:  [91 %-99 %] 94 % (04/25 0520) Last BM Date: 10/02/15 1080 po  REGULAR diet  1650 URINE  Afebrile, VSS WBC up some Intake/Output from previous day: 04/24 0701 - 04/25 0700 In: 2666.7 [P.O.:1080; I.V.:1586.7] Out: 1700 [Urine:1650; Blood:50] Intake/Output this shift:    General appearance: alert, cooperative and no distress incision looks fine, the scrotum is completely distended and tight > 25 cm is diameter rignt now.  He has support in place  Lab Results:   Recent Labs  10/03/15 0744 10/04/15 0533  WBC 6.4 14.5*  HGB 13.8 12.1*  HCT 41.5 37.2*  PLT 252 234    BMET  Recent Labs  10/03/15 0744 10/04/15 0533  NA 141 136  K 2.9* 4.3  CL 108 102  CO2 19* 25  GLUCOSE 183* 112*  BUN 13 12  CREATININE 1.16 1.19  CALCIUM 8.8* 8.5*   PT/INR  Recent Labs  10/03/15 0744  LABPROT 13.4  INR 1.00     Recent Labs Lab 10/03/15 0744  AST 25  ALT 20  ALKPHOS 51  BILITOT 1.1  PROT 6.8  ALBUMIN 3.7     Lipase  No results found for: LIPASE   Studies/Results: Ct Abdomen Pelvis W Contrast  10/03/2015  CLINICAL DATA:  Incarcerated hernia EXAM: CT ABDOMEN AND PELVIS WITH CONTRAST TECHNIQUE: Multidetector CT imaging of the abdomen and pelvis was performed using the standard protocol following bolus administration of intravenous contrast. CONTRAST:  1 ISOVUE-300 IOPAMIDOL (ISOVUE-300) INJECTION 61% COMPARISON:  12/10/2008 FINDINGS: Lower chest: The lung bases are unremarkable. Breathing motion artifacts are noted. Hepatobiliary: Enhanced liver is unremarkable. No calcified gallstones are noted within gallbladder.  Pancreas: No mass, inflammatory changes, or other significant abnormality. Spleen: Within normal limits in size and appearance. Adrenals/Urinary Tract: No adrenal gland mass. Enhanced kidneys are symmetrical in size. No hydronephrosis or hydroureter. Delayed renal images shows bilateral renal symmetrical excretion. Bilateral visualized proximal ureter is unremarkable. Mild distorted right anterior urinary bladder by a mass effect from right inguinal hernia. Stomach/Bowel: No gastric outlet obstruction. Mild distended small bowel loops with fluid in right lower quadrant. There is mesenteric edema and tiny amount of mesenteric fluid in right lower pelvis and right inguinal region. There is a large right inguinal scrotal canal hernia containing fluid distended small bowel loops and congested vessels. Tiny central mesenteric edema. There is transition point in caliber of small bowel in a loop entering the hernia best seen in axial image 70. Findings are consistent with small bowel obstruction due to incarcerated right inguinal scrotal canal hernia. The hernia and is best visualized in coronal image 42 measures 16 cm cranial caudally by 10 cm transverse diameter. Small amount of fluid noted in dependent portion of the hernia. There is thickening of dependent scrotal wall. No pericecal inflammation. The appendix is not clearly identified. Vascular/Lymphatic: There is no aortic aneurysm. No retroperitoneal or mesenteric adenopathy. Reproductive: Prostate gland and seminal vesicles are unremarkable. There is mass effect on the penile base with left lateral deviation from door large right inguinal scrotal hernia. Other: There is a small left inguinal canal hernia  containing fluid measures 2.3 cm. No free abdominal air. No pelvic free air. Musculoskeletal: Sagittal images of the spine shows mild degenerative changes lower thoracic spine. There is Schmorl's node deformity lower endplate of L1 vertebral body. IMPRESSION: 1.  Findings consistent with small bowel obstruction due to incarcerated right inguinal scrotal hernia containing omental fat and multiple fluid distended small bowel loops. There is small amount of fluid within dependent aspect of the hernia. Hernia measures at least 16 x 10 cm. There is also mesenteric edema and engorged mesenteric vessels in right lower anterior pelvis/inguinal region. Mesenteric edema and congested vessels are noted centrally within hernia. 2. No pericecal inflammation. The appendix is not clearly identified. 3. No free abdominal air. 4. No hydronephrosis or hydroureter. These results were called by telephone at the time of interpretation on 10/03/2015 at 9:01 am to Dr. Arby Barrette , who verbally acknowledged these results. Electronically Signed   By: Natasha Mead M.D.   On: 10/03/2015 09:03    Medications:    . 0.9 % NaCl with KCl 20 mEq / L 100 mL/hr at 10/04/15 0251  . lactated ringers 50 mL/hr at 10/03/15 1012   Prior to Admission medications   Not on File    Assessment/Plan Right inguinal hernia S/p repair of large right inguinoscrotal hernia with mesh, 10/03/15, Dr. Luisa Hart FEN:  Regular diet and IV fluids ID:  preop Cefazolin only  VTE: SCD/heparin this afternoon.   Plan:  Continue ICE, and pain meds, scrotal support.  Continue Toradol, dilaudid, and increase oxycodone.  Tylenol 1 gm q6h, Oxycodone 10-20 PO q4h, continue toradol, and Diladid prn.  Recheck labs in AM.  Decrease IV fluids.    LOS: 1 day    Jj Enyeart 10/04/2015 [phone]

## 2015-10-04 NOTE — Discharge Instructions (Signed)
[facility] _______[facility] ° °UMBILICAL OR INGUINAL HERNIA REPAIR: POST OP INSTRUCTIONS ° °Always review your discharge instruction sheet given to you by the facility where your surgery was performed. °IF YOU HAVE DISABILITY OR FAMILY LEAVE FORMS, YOU MUST BRING THEM TO THE OFFICE FOR PROCESSING.   °DO NOT GIVE THEM TO YOUR DOCTOR. ° °1. A  prescription for pain medication may be given to you upon discharge.  Take your pain medication as prescribed, if needed.  If narcotic pain medicine is not needed, then you may take acetaminophen (Tylenol) or ibuprofen (Advil) as needed. °2. Take your usually prescribed medications unless otherwise directed. °3. If you need a refill on your pain medication, please contact your pharmacy.  They will contact our office to request authorization. Prescriptions will not be filled after 5 pm or on week-ends. °4. You should follow a light diet the first 24 hours after arrival home, such as soup and crackers, etc.  Be sure to include lots of fluids daily.  Resume your normal diet the day after surgery. °5. Most patients will experience some swelling and bruising around the umbilicus or in the groin and scrotum.  Ice packs and reclining will help.  Swelling and bruising can take several days to resolve.  °6. It is common to experience some constipation if taking pain medication after surgery.  Increasing fluid intake and taking a stool softener (such as Colace) will usually help or prevent this problem from occurring.  A mild laxative (Milk of Magnesia or Miralax) should be taken according to package directions if there are no bowel movements after 48 hours. °7. Unless discharge instructions indicate otherwise, you may remove your bandages 24-48 hours after surgery, and you may shower at that time.  You may have steri-strips (small skin tapes) in place directly over the incision.  These strips should be left on the skin for 7-10 days.  If your surgeon used skin glue on the  incision, you may shower in 24 hours.  The glue will flake off over the next 2-3 weeks.  Any sutures or staples will be removed at the office during your follow-up visit. °8. ACTIVITIES:  You may resume regular (light) daily activities beginning the next day--such as daily self-care, walking, climbing stairs--gradually increasing activities as tolerated.  You may have sexual intercourse when it is comfortable.  Refrain from any heavy lifting or straining until approved by your doctor. °a. You may drive when you are no longer taking prescription pain medication, you can comfortably wear a seatbelt, and you can safely maneuver your car and apply brakes. °b. RETURN TO WORK:  __________________________________________________________ °9. You should see your doctor in the office for a follow-up appointment approximately 2-3 weeks after your surgery.  Make sure that you call for this appointment within a day or two after you arrive home to insure a convenient appointment time. °10. OTHER INSTRUCTIONS:  __________________________________________________________________________________________________________________________________________________________________________________________  °WHEN TO CALL YOUR DOCTOR: °1. Fever over 101.0 °2. Inability to urinate °3. Nausea and/or vomiting °4. Extreme swelling or bruising °5. Continued bleeding from incision. °6. Increased pain, redness, or drainage from the incision ° °The clinic staff is available to answer your questions during regular business hours.  Please don’t hesitate to call and ask to speak to one of the nurses for clinical concerns.  If you have a medical emergency, go to the nearest emergency room or call 911.  A surgeon from [facility] is always on call at the hospital ° ° °  [address], [address], [place], [place]  [address] ?  [address] [address], [place], [place]   [address] [phone]) [phone] ? [phone] ? FAX [phone]) [phone] Web site:  [email or web address]  KEEP ICE ON THE SITE AND CONTINUE SCROTAL SUPPORT TILL SWELLING RESOLVES.

## 2015-10-04 NOTE — Progress Notes (Signed)
Pt complaining of severe abdominal pain unrelieved by Dilaudid, Percocet, and ice pack.. Pt states the pain is the same as the pain he had when he was in the ER before surgery. The pain is not isolated in the incisional area,but allover his abdomen. Abdomen is soft,tender. BS hypoactive. Dr. Derrell Lollingamirez notified. Patient was medicated with 30mg  Toradol and 1 mg Dilaudid at 0209.  At 0230 pt was asleep.  At 0255, pt complained of itching but reported that his pain level was down to 3. He was medicated with IV Benadryl for itching.

## 2015-10-04 NOTE — Anesthesia Postprocedure Evaluation (Signed)
Anesthesia Post Note  Patient: Jeremy Singh  Procedure(s) Performed: Procedure(s) (LRB): RIGHT HERNIA REPAIR INGUINAL INCARCERATED  (Right) INSERTION OF MESH (Right)  Patient location during evaluation: PACU Anesthesia Type: General Level of consciousness: awake and alert Pain management: pain level controlled Vital Signs Assessment: post-procedure vital signs reviewed and stable Respiratory status: spontaneous breathing, nonlabored ventilation, respiratory function stable and patient connected to nasal cannula oxygen Cardiovascular status: blood pressure returned to baseline and stable Postop Assessment: no signs of nausea or vomiting Anesthetic complications: no    Last Vitals:  Filed Vitals:   10/04/15 0137 10/04/15 0520  BP: 148/59 132/68  Pulse: 101 72  Temp: 37.6 C 37 C  Resp: 20 18    Last Pain:  Filed Vitals:   10/04/15 1524  PainSc: Asleep                 Shelton SilvasKevin D Jumanah Hynson

## 2015-10-05 NOTE — Progress Notes (Deleted)
Physician Discharge Summary  Patient ID: Jeremy Singh MRN: [id number] DOB/AGE: 46/02/1971 45 y.o.  Admit date: 10/03/2015 Discharge date: 10/05/2015  Admission Diagnoses:  Small bowel obstruction 2* a large (16x10cm) incarcerated right inguinal hernia Large right incarcerated inguinoscrotal hernia.  Discharge Diagnoses:  Reducible large right inguinoscrotal hernia.   Active Problems:   Incarcerated right inguinal hernia   Right inguinal hernia   PROCEDURES: Repair of right inguinal hernia with mesh, 10/03/15, Dr. Pasty Spillersornett  Hospital Course:  45 y/o healthy AA male with known large right inguinal hernia with significant bowel in scrotum was awoken this am around 5-5:30am with acute severe 10/10 abdominal, groin, and scrotal pain. He has had the hernia for years, and he saw a [facility] surgeon in 2015 who referred him for a CT scan done November 2015, but because he was asymptomatic from it he never had it repaired. It enlarges on and off, but this time it wouldn't go back in. Lying flat makes the pain worse. He is writhing in pain trying to find a comfortable position. He vomited several times. He denies any diarrhea or constipation. No fevers/chills, dysuria, CP/SOB. Last BM was yesterday, last meal yesterday. Denies heavy lifting cause it to come on. No h/o abdominal surgeries or hospitalizations.   CT reviewed and shows SBO secondary to right incarcerated inguinal hernia containing omental fat and multiple fluid filled distended SB loops. Hernia is large measuring 16cm x 10cm. Mesenteric edema and vessel congestion noted centrally. No fre air, no appendiceal inflammation, no hydronephrosis or hydroureter.  He was seen in the ED and taken to the OR the that day.  With sedation the hernia reduced its self. Pt underwent hernia repair with Mesh.  He did well.  Post op he continued to have significant pain. This was addressed.  Pain and swelling improved and by the afternoon  of POD 1 he was ready to go home.  He is to maintain scrotal support until it has improved.  No lifting for 6 weeks.  Follow up in the clinic.  Marland Kitchen.  Condition on d/c:  Improved           Disposition: 01-Home or Self Care     Medication List    TAKE these medications        acetaminophen 500 MG tablet  Commonly known as:  TYLENOL  Take 2 tablets (1,000 mg total) by mouth every 6 (six) hours.     ibuprofen 200 MG tablet  Commonly known as:  MOTRIN IB  You can take 2-3 tablets every 6 hours as needed for pain.  You can buy this at any pharmacy.     Oxycodone HCl 10 MG Tabs  Take 1-2 tablets (10-20 mg total) by mouth every 4 (four) hours as needed for moderate pain.           Follow-up Information    Follow up with [facility] On 10/26/2015.   Specialty:  General Surgery   Why:  Your appointment is at 10:15 AM, be at the office 30 minutes early for check in.   Contact information:   [address] [place] [address] [phone]       Signed: Sherrie GeorgeJENNINGS,[name] 10/05/2015, 2:29 PM

## 2015-10-05 NOTE — Discharge Summary (Signed)
Jeremy GeorgeWillard Rosalina Dingwall, PA-C Physician Assistant Cosign Needed Surgery Progress Notes 10/04/2015 4:35 PM    Expand All Collapse All   Physician Discharge Summary  Patient ID: Jeremy DomDavid L Singh MRN: [id number] DOB/AGE: 47/02/1971 47 y.o.  Admit date: 10/03/2015 Discharge date: 10/05/2015  Admission Diagnoses:  Small bowel obstruction 2* a large (16x10cm) incarcerated right inguinal hernia Large right incarcerated inguinoscrotal hernia.  Discharge Diagnoses:  Reducible large right inguinoscrotal hernia.   Active Problems:  Incarcerated right inguinal hernia  Right inguinal hernia   PROCEDURES: Repair of right inguinal hernia with mesh, 10/03/15, Dr. Pasty Spillersornett  Hospital Course:  45 y/o healthy AA male with known large right inguinal hernia with significant bowel in scrotum was awoken this am around 5-5:30am with acute severe 10/10 abdominal, groin, and scrotal pain. He has had the hernia for years, and he saw a [facility]ic[facility] surgeon in 2015 who referred him for a CT scan done November 2015, but because he was asymptomatic from it he never had it repaired. It enlarges on and off, but this time it wouldn't go back in. Lying flat makes the pain worse. He is writhing in pain trying to find a comfortable position. He vomited several times. He denies any diarrhea or constipation. No fevers/chills, dysuria, CP/SOB. Last BM was yesterday, last meal yesterday. Denies heavy lifting cause it to come on. No h/o abdominal surgeries or hospitalizations.   CT reviewed and shows SBO secondary to right incarcerated inguinal hernia containing omental fat and multiple fluid filled distended SB loops. Hernia is large measuring 16cm x 10cm. Mesenteric edema and vessel congestion noted centrally. No fre air, no appendiceal inflammation, no hydronephrosis or hydroureter.  He was seen in the ED and taken to the OR the that day. With sedation the hernia reduced its self. Pt underwent hernia repair with  Mesh. He did well. Post op he continued to have significant pain. This was addressed. Pain and swelling improved and by the afternoon of POD 1 he was ready to go home. He is to maintain scrotal support until it has improved. No lifting for 6 weeks. Follow up in the clinic. Marland Kitchen.  Condition on d/c: Improved          Disposition: 01-Home or Self Care     Medication List    TAKE these medications       acetaminophen 500 MG tablet  Commonly known as: TYLENOL  Take 2 tablets (1,000 mg total) by mouth every 6 (six) hours.     ibuprofen 200 MG tablet  Commonly known as: MOTRIN IB  You can take 2-3 tablets every 6 hours as needed for pain. You can buy this at any pharmacy.     Oxycodone HCl 10 MG Tabs  Take 1-2 tablets (10-20 mg total) by mouth every 4 (four) hours as needed for moderate pain.           Follow-up Information    Follow up with [facility] On 10/26/2015.   Specialty: General Surgery   Why: Your appointment is at 10:15 AM, be at the office 30 minutes early for check in.   Contact information:   [address] [place] [address] [phone]       Signed: Sherrie Singh,Jeremy Singh 10/05/2015, 2:29 PM

## 2015-10-08 ENCOUNTER — Encounter (HOSPITAL_COMMUNITY): Payer: Self-pay

## 2015-10-08 ENCOUNTER — Inpatient Hospital Stay (HOSPITAL_COMMUNITY)
Admission: EM | Admit: 2015-10-08 | Discharge: 2015-10-17 | DRG: 351 | Disposition: A | Discharge status: Home / Self Care | Payer: Self-pay | Attending: Surgery | Admitting: Surgery

## 2015-10-08 ENCOUNTER — Emergency Department (HOSPITAL_COMMUNITY): Payer: Self-pay

## 2015-10-08 DIAGNOSIS — K56609 Unspecified intestinal obstruction, unspecified as to partial versus complete obstruction: Secondary | ICD-10-CM

## 2015-10-08 DIAGNOSIS — Z0189 Encounter for other specified special examinations: Secondary | ICD-10-CM

## 2015-10-08 DIAGNOSIS — Z8719 Personal history of other diseases of the digestive system: Secondary | ICD-10-CM

## 2015-10-08 DIAGNOSIS — Z9889 Other specified postprocedural states: Secondary | ICD-10-CM

## 2015-10-08 DIAGNOSIS — K567 Ileus, unspecified: Secondary | ICD-10-CM

## 2015-10-08 DIAGNOSIS — K4031 Unilateral inguinal hernia, with obstruction, without gangrene, recurrent: Secondary | ICD-10-CM | POA: Diagnosis present

## 2015-10-08 DIAGNOSIS — K9189 Other postprocedural complications and disorders of digestive system: Principal | ICD-10-CM | POA: Diagnosis present

## 2015-10-08 LAB — CBC WITH DIFFERENTIAL/PLATELET
BASOS ABS: 0 10*3/uL (ref 0.0–0.1)
BASOS PCT: 0 %
EOS ABS: 0.1 10*3/uL (ref 0.0–0.7)
Eosinophils Relative: 1 %
HEMATOCRIT: 40.9 % (ref 39.0–52.0)
HEMOGLOBIN: 13.5 g/dL (ref 13.0–17.0)
Lymphocytes Relative: 8 %
Lymphs Abs: 0.7 10*3/uL (ref 0.7–4.0)
MCH: 31 pg (ref 26.0–34.0)
MCHC: 33 g/dL (ref 30.0–36.0)
MCV: 94 fL (ref 78.0–100.0)
Monocytes Absolute: 1.1 10*3/uL — ABNORMAL HIGH (ref 0.1–1.0)
Monocytes Relative: 12 %
NEUTROS ABS: 6.9 10*3/uL (ref 1.7–7.7)
NEUTROS PCT: 79 %
Platelets: 321 10*3/uL (ref 150–400)
RBC: 4.35 MIL/uL (ref 4.22–5.81)
RDW: 12.5 % (ref 11.5–15.5)
WBC: 8.8 10*3/uL (ref 4.0–10.5)

## 2015-10-08 LAB — COMPREHENSIVE METABOLIC PANEL
ALK PHOS: 54 U/L (ref 38–126)
ALT: 17 U/L (ref 17–63)
ANION GAP: 14 (ref 5–15)
AST: 18 U/L (ref 15–41)
Albumin: 3.2 g/dL — ABNORMAL LOW (ref 3.5–5.0)
BILIRUBIN TOTAL: 1.5 mg/dL — AB (ref 0.3–1.2)
BUN: 28 mg/dL — ABNORMAL HIGH (ref 6–20)
CALCIUM: 9.3 mg/dL (ref 8.9–10.3)
CO2: 24 mmol/L (ref 22–32)
Chloride: 101 mmol/L (ref 101–111)
Creatinine, Ser: 1.17 mg/dL (ref 0.61–1.24)
Glucose, Bld: 119 mg/dL — ABNORMAL HIGH (ref 65–99)
POTASSIUM: 4 mmol/L (ref 3.5–5.1)
Sodium: 139 mmol/L (ref 135–145)
TOTAL PROTEIN: 7.8 g/dL (ref 6.5–8.1)

## 2015-10-08 LAB — URINE MICROSCOPIC-ADD ON
BACTERIA UA: NONE SEEN
WBC, UA: NONE SEEN WBC/hpf (ref 0–5)

## 2015-10-08 LAB — URINALYSIS, ROUTINE W REFLEX MICROSCOPIC
Glucose, UA: NEGATIVE mg/dL
Ketones, ur: NEGATIVE mg/dL
LEUKOCYTES UA: NEGATIVE
NITRITE: NEGATIVE
PH: 5.5 (ref 5.0–8.0)
Protein, ur: NEGATIVE mg/dL
SPECIFIC GRAVITY, URINE: 1.04 — AB (ref 1.005–1.030)

## 2015-10-08 LAB — LIPASE, BLOOD: LIPASE: 24 U/L (ref 11–51)

## 2015-10-08 MED ORDER — SODIUM CHLORIDE 0.9 % IV BOLUS (SEPSIS)
1000.0000 mL | Freq: Once | INTRAVENOUS | Status: AC
  Administered 2015-10-08: 1000 mL via INTRAVENOUS

## 2015-10-08 MED ORDER — ACETAMINOPHEN 650 MG RE SUPP: 650.0000 mg | Freq: Four times a day (QID) | RECTAL | Status: DC | PRN

## 2015-10-08 MED ORDER — ACETAMINOPHEN 325 MG PO TABS: 650.0000 mg | ORAL_TABLET | Freq: Four times a day (QID) | ORAL | Status: DC | PRN

## 2015-10-08 MED ORDER — POTASSIUM CHLORIDE IN NACL 20-0.9 MEQ/L-% IV SOLN
INTRAVENOUS | Status: AC
  Administered 2015-10-08 – 2015-10-13 (×9): via INTRAVENOUS
  Filled 2015-10-08 (×15): qty 1000

## 2015-10-08 MED ORDER — SODIUM CHLORIDE 0.9 % IV SOLN
INTRAVENOUS | Status: DC
Start: 2015-10-08 — End: 2015-10-13

## 2015-10-08 MED ORDER — ONDANSETRON HCL 4 MG/2ML IJ SOLN
4.0000 mg | Freq: Once | INTRAMUSCULAR | Status: AC
  Administered 2015-10-08: 4 mg via INTRAVENOUS
  Filled 2015-10-08: qty 2

## 2015-10-08 MED ORDER — MORPHINE SULFATE (PF) 2 MG/ML IV SOLN
1.0000 mg | INTRAVENOUS | Status: DC | PRN
  Administered 2015-10-08: 4 mg via INTRAVENOUS
  Administered 2015-10-08 – 2015-10-09 (×2): 2 mg via INTRAVENOUS
  Administered 2015-10-09: 4 mg via INTRAVENOUS
  Administered 2015-10-09: 2 mg via INTRAVENOUS
  Administered 2015-10-09 – 2015-10-11 (×6): 4 mg via INTRAVENOUS
  Administered 2015-10-11: 2 mg via INTRAVENOUS
  Administered 2015-10-11 – 2015-10-15 (×13): 4 mg via INTRAVENOUS
  Administered 2015-10-15 – 2015-10-16 (×2): 2 mg via INTRAVENOUS
  Filled 2015-10-08 (×2): qty 2
  Filled 2015-10-08: qty 1
  Filled 2015-10-08: qty 2
  Filled 2015-10-08: qty 1
  Filled 2015-10-08: qty 2
  Filled 2015-10-08 (×3): qty 1
  Filled 2015-10-08 (×15): qty 2
  Filled 2015-10-08: qty 1
  Filled 2015-10-08 (×2): qty 2

## 2015-10-08 MED ORDER — HYDROMORPHONE HCL 1 MG/ML IJ SOLN
0.5000 mg | INTRAMUSCULAR | Status: DC | PRN
  Administered 2015-10-08 (×2): 1 mg via INTRAVENOUS
  Filled 2015-10-08 (×2): qty 1

## 2015-10-08 MED ORDER — DIPHENHYDRAMINE HCL 50 MG/ML IJ SOLN: 25.0000 mg | Freq: Four times a day (QID) | INTRAMUSCULAR | Status: DC | PRN

## 2015-10-08 MED ORDER — ONDANSETRON HCL 4 MG/2ML IJ SOLN: 4.0000 mg | Freq: Four times a day (QID) | INTRAMUSCULAR | Status: DC | PRN

## 2015-10-08 MED ORDER — DIPHENHYDRAMINE HCL 25 MG PO CAPS: 25.0000 mg | ORAL_CAPSULE | Freq: Four times a day (QID) | ORAL | Status: DC | PRN

## 2015-10-08 MED ORDER — ENOXAPARIN SODIUM 40 MG/0.4ML ~~LOC~~ SOLN
40.0000 mg | SUBCUTANEOUS | Status: DC
  Administered 2015-10-09 – 2015-10-16 (×8): 40 mg via SUBCUTANEOUS
  Filled 2015-10-08 (×9): qty 0.4

## 2015-10-08 MED ORDER — ONDANSETRON 4 MG PO TBDP: 4.0000 mg | ORAL_TABLET | Freq: Four times a day (QID) | ORAL | Status: DC | PRN

## 2015-10-08 NOTE — H&P (Signed)
Jeremy Singh is an 45 y.o. male.   Chief Complaint: abdominal distension, some nausea and vomiting post op HPI: Pt presented 10/03/15 with a significant amount of bowel in the scrotum and significant RIH.  He was writhing in pain at the time of exam.  He was seen and taken to the OR for repair of the hernia later that day.  Post op he had allot of pain first post op evening, we got better control and he did well the following AM.  He ate a regular breakfast, walked the halls and was ready for discharge later that first post op day.  Since discharge, he hasn't really be able to eat.  Abdomen is distended, no flatus, he vomited 4/26 x 2, but has not vomited since.  He has had some flatus this AM, and this is the first time.  He is very distended, but not really tender.   Work up shows he is afebrile, BP up some.  HR around 100.  Labs OK, BUN up some and Bilirubin up slightly.  Film shows: distended bowel loops, with air fluid levels that looks like SBO.  Lungs were clear.  I plan to admit to observation.  Past Medical History  Diagnosis Date  . Abscess of back     multiple other sites    Past Surgical History  Procedure Laterality Date  . Inguinal hernia repair Right 10/03/2015    Procedure: RIGHT HERNIA REPAIR INGUINAL INCARCERATED ;  Surgeon: Erroll Luna, MD;  Location: [facility];  Service: General;  Laterality: Right;  . Insertion of mesh Right 10/03/2015    Procedure: INSERTION OF MESH;  Surgeon: Erroll Luna, MD;  Location: [facility];  Service: General;  Laterality: Right;    Family History  Problem Relation Age of Onset  . Cancer Mother   . Cancer Father    Social History:  reports that he has never smoked. He does not have any smokeless tobacco history on file. He reports that he drinks alcohol. He reports that he does not use illicit drugs.  Allergies: No Known Allergies  Prior to Admission medications   Medication Sig Start Date End Date Taking? Authorizing Provider  acetaminophen  (TYLENOL) 500 MG tablet Take 2 tablets (1,000 mg total) by mouth every 6 (six) hours. 10/04/15   Earnstine Regal, PA-C  ibuprofen (MOTRIN IB) 200 MG tablet You can take 2-3 tablets every 6 hours as needed for pain.  You can buy this at any pharmacy. 10/04/15   Earnstine Regal, PA-C  oxyCODONE 10 MG TABS Take 1-2 tablets (10-20 mg total) by mouth every 4 (four) hours as needed for moderate pain. 10/04/15   Earnstine Regal, PA-C    Results for orders placed or performed during the hospital encounter of 10/08/15 (from the past 48 hour(s))  Comprehensive metabolic panel     Status: Abnormal   Collection Time: 10/08/15 10:52 AM  Result Value Ref Range   Sodium 139 135 - 145 mmol/L   Potassium 4.0 3.5 - 5.1 mmol/L   Chloride 101 101 - 111 mmol/L   CO2 24 22 - 32 mmol/L   Glucose, Bld 119 (H) 65 - 99 mg/dL   BUN 28 (H) 6 - 20 mg/dL   Creatinine, Ser 1.17 0.61 - 1.24 mg/dL   Calcium 9.3 8.9 - 10.3 mg/dL   Total Protein 7.8 6.5 - 8.1 g/dL   Albumin 3.2 (L) 3.5 - 5.0 g/dL   AST 18 15 - 41 U/L   ALT 17  17 - 63 U/L   Alkaline Phosphatase 54 38 - 126 U/L   Total Bilirubin 1.5 (H) 0.3 - 1.2 mg/dL   GFR calc non Af Amer >60 >60 mL/min   GFR calc Af Amer >60 >60 mL/min    Comment: (NOTE) The eGFR has been calculated using the CKD EPI equation. This calculation has not been validated in all clinical situations. eGFR's persistently <60 mL/min signify possible Chronic Kidney Disease.    Anion gap 14 5 - 15  Lipase, blood     Status: None   Collection Time: 10/08/15 10:52 AM  Result Value Ref Range   Lipase 24 11 - 51 U/L  CBC WITH DIFFERENTIAL     Status: Abnormal   Collection Time: 10/08/15 10:52 AM  Result Value Ref Range   WBC 8.8 4.0 - 10.5 K/uL   RBC 4.35 4.22 - 5.81 MIL/uL   Hemoglobin 13.5 13.0 - 17.0 g/dL   HCT 40.9 39.0 - 52.0 %   MCV 94.0 78.0 - 100.0 fL   MCH 31.0 26.0 - 34.0 pg   MCHC 33.0 30.0 - 36.0 g/dL   RDW 12.5 11.5 - 15.5 %   Platelets 321 150 - 400 K/uL   Neutrophils  Relative % 79 %   Neutro Abs 6.9 1.7 - 7.7 K/uL   Lymphocytes Relative 8 %   Lymphs Abs 0.7 0.7 - 4.0 K/uL   Monocytes Relative 12 %   Monocytes Absolute 1.1 (H) 0.1 - 1.0 K/uL   Eosinophils Relative 1 %   Eosinophils Absolute 0.1 0.0 - 0.7 K/uL   Basophils Relative 0 %   Basophils Absolute 0.0 0.0 - 0.1 K/uL   Dg Abd Acute W/chest  10/08/2015  CLINICAL DATA:  Distended painful stomach. History of right inguinal hernia repair 5 days ago. EXAM: DG ABDOMEN ACUTE W/ 1V CHEST COMPARISON:  CT abdomen dated 10/03/2015. FINDINGS: Single view of the chest: Heart size is normal.  Lungs are clear. Supine and upright views of the abdomen: Distended small bowel loops within the upper abdomen and left abdomen, with associated air-fluid levels suggesting a mechanical small bowel obstruction. No evidence of free intraperitoneal air. IMPRESSION: 1. Findings suggest persistent mechanical small bowel obstruction. 2. Lungs are clear and there is no evidence of acute cardiopulmonary abnormality Electronically Signed   By: Franki Cabot M.D.   On: 10/08/2015 11:23    Review of Systems  Constitutional: Negative.   HENT: Negative.   Eyes: Negative.   Respiratory: Negative.   Cardiovascular: Negative.   Gastrointestinal: Positive for nausea, vomiting, abdominal pain (abdominal distension) and constipation.       Few high pitched bowel sounds.  Genitourinary: Negative.   Musculoskeletal: Negative.   Skin: Negative.   Neurological: Negative.   Endo/Heme/Allergies: Negative.   Psychiatric/Behavioral: Negative.     Blood pressure 125/92, pulse 101, temperature 98.6 F (37 C), resp. rate 16, SpO2 99 %. Physical Exam  Constitutional: He is oriented to person, place, and time. He appears well-developed and well-nourished. No distress.  HENT:  Head: Normocephalic and atraumatic.  Nose: Nose normal.  Eyes: Right eye exhibits no discharge. Left eye exhibits no discharge. No scleral icterus.  Neck: No JVD  present. No tracheal deviation present. No thyromegaly present.  Cardiovascular: Normal rate, regular rhythm, normal heart sounds and intact distal pulses.   No murmur heard. Respiratory: Effort normal and breath sounds normal. No respiratory distress. He has no wheezes. He has no rales. He exhibits no tenderness.  GI: He  exhibits distension. He exhibits no mass. There is tenderness (sore). There is no rebound and no guarding.  Incision looks fine.  Genitourinary:  He has significant scrotal swelling that continues and looks larger to me.  Musculoskeletal: He exhibits no edema or tenderness.  Lymphadenopathy:    He has no cervical adenopathy.  Neurological: He is alert and oriented to person, place, and time. A cranial nerve deficit is present.  Skin: Skin is warm and dry. No rash noted. He is not diaphoretic. No erythema. No pallor.  Psychiatric: He has a normal mood and affect. His behavior is normal. Judgment and thought content normal.     Assessment/Plan Ileus vs SBO s/p RIH repair with mesh 10/03/15, Dr. Brantley Stage Ongoing scrotal swelling and edema  Plan:  I am going to admit and place on IV fluid, continue bowel rest.  Continue ambulation and see how he does.  Recheck films in AM.  I wrote for an NG tube if he has any nausea or vomiting.  I think it would help now but I will wait till Dr. Ninfa Linden see's him.    Katlen Seyer, PA-C 10/08/2015, 12:08 PM

## 2015-10-08 NOTE — ED Notes (Signed)
Pt transported to xray 

## 2015-10-08 NOTE — ED Notes (Signed)
Pt given urinal and encouraged to provided us sample.

## 2015-10-08 NOTE — ED Notes (Signed)
Report attempted.  Will call back.   

## 2015-10-08 NOTE — ED Notes (Signed)
Patient here with ongoing abdominal swelling since inguinal hernia repair Monday. Taking narcotics and constipation reported

## 2015-10-08 NOTE — ED Provider Notes (Signed)
CSN: [id number]     Arrival date & time 10/08/15  [id number] History   First MD Initiated Contact with Patient 10/08/15 [phone]     Chief Complaint  Patient presents with  . Abdominal Pain    Patient is a 45 y.o. male presenting with abdominal pain.  Abdominal Pain  Pt had hernia surgery on Monday.  Since that time he has been having swelling in his lower abdomen.  He has not been able to eat well because of the pain and swelling.  No fevers.  He vomited some liquid on Wednesday but none since then.  He tried over the counter pain medications which seems to help.  He has not been taking the narcotic pain medications.  He feels like the pain is the groin is well controlled but his stomach feels more swollen.  Last BM yesterday AM.  He has been passing gas.  He ate some chicken broth yesterday.  He did not vomit but he gets nauseated when he smells food. Past Medical History  Diagnosis Date  . Abscess of back     multiple other sites   Past Surgical History  Procedure Laterality Date  . Inguinal hernia repair Right 10/03/2015    Procedure: RIGHT HERNIA REPAIR INGUINAL INCARCERATED ;  Surgeon: Harriette Bouillonhomas Cornett, MD;  Location: [facility];  Service: General;  Laterality: Right;  . Insertion of mesh Right 10/03/2015    Procedure: INSERTION OF MESH;  Surgeon: Harriette Bouillonhomas Cornett, MD;  [facility];  Service: General;  Laterality: Right;   Family History  Problem Relation Age of Onset  . Cancer Mother   . Cancer Father    Social History  Substance Use Topics  . Smoking status: Never Smoker   . Smokeless tobacco: None  . Alcohol Use: Yes     Comment: occasionally    Review of Systems  Gastrointestinal: Positive for abdominal pain.  All other systems reviewed and are negative.     Allergies  Review of patient's allergies indicates no known allergies.  Home Medications   Prior to Admission medications   Medication Sig Start Date End Date Taking? Authorizing Provider  acetaminophen (TYLENOL) 500 MG  tablet Take 2 tablets (1,000 mg total) by mouth every 6 (six) hours. 10/04/15   Sherrie GeorgeWillard Jennings, PA-C  ibuprofen (MOTRIN IB) 200 MG tablet You can take 2-3 tablets every 6 hours as needed for pain.  You can buy this at any pharmacy. 10/04/15   Sherrie GeorgeWillard Jennings, PA-C  oxyCODONE 10 MG TABS Take 1-2 tablets (10-20 mg total) by mouth every 4 (four) hours as needed for moderate pain. 10/04/15   Sherrie GeorgeWillard Jennings, PA-C   BP 125/92 mmHg  Pulse 101  Temp(Src) 98.6 F (37 C)  Resp 16  SpO2 99% Physical Exam  Constitutional: No distress.  HENT:  Head: Normocephalic and atraumatic.  Right Ear: External ear normal.  Left Ear: External ear normal.  Eyes: Conjunctivae are normal. Right eye exhibits no discharge. Left eye exhibits no discharge. No scleral icterus.  Neck: Neck supple. No tracheal deviation present.  Cardiovascular: Normal rate, regular rhythm and intact distal pulses.   Pulmonary/Chest: Effort normal and breath sounds normal. No stridor. No respiratory distress. He has no wheezes. He has no rales.  Abdominal: Soft. He exhibits distension. Bowel sounds are increased. There is generalized tenderness. There is no rigidity, no rebound and no guarding. Inguinal: large amount of swelling in the right inguinal region and right sctoum, no erythema around surgical wound, no drainage.  Musculoskeletal: He exhibits no edema or tenderness.  Neurological: He is alert. He has normal strength. No cranial nerve deficit (no facial droop, extraocular movements intact, no slurred speech) or sensory deficit. He exhibits normal muscle tone. He displays no seizure activity. Coordination normal.  Skin: Skin is warm and dry. No rash noted. He is not diaphoretic.  Psychiatric: He has a normal mood and affect.  Nursing note and vitals reviewed.   ED Course  Procedures (including critical care time) Labs Review Labs Reviewed  CBC WITH DIFFERENTIAL/PLATELET - Abnormal; Notable for the following:    Monocytes  Absolute 1.1 (*)    All other components within normal limits  COMPREHENSIVE METABOLIC PANEL  LIPASE, BLOOD  URINALYSIS, ROUTINE W REFLEX MICROSCOPIC (NOT AT [facility])    Imaging Review Dg Abd Acute W/chest  10/08/2015  CLINICAL DATA:  Distended painful stomach. History of right inguinal hernia repair 5 days ago. EXAM: DG ABDOMEN ACUTE W/ 1V CHEST COMPARISON:  CT abdomen dated 10/03/2015. FINDINGS: Single view of the chest: Heart size is normal.  Lungs are clear. Supine and upright views of the abdomen: Distended small bowel loops within the upper abdomen and left abdomen, with associated air-fluid levels suggesting a mechanical small bowel obstruction. No evidence of free intraperitoneal air. IMPRESSION: 1. Findings suggest persistent mechanical small bowel obstruction. 2. Lungs are clear and there is no evidence of acute cardiopulmonary abnormality Electronically Signed   By: Bary Richard M.D.   On: 10/08/2015 11:23   I have personally reviewed and evaluated these images and lab results as part of my medical decision-making.  Medications  sodium chloride 0.9 % bolus 1,000 mL (1,000 mLs Intravenous New Bag/Given 10/08/15 1123)    And  0.9 %  sodium chloride infusion ( Intravenous Rate/Dose Change 10/08/15 1131)  ondansetron (ZOFRAN) injection 4 mg (4 mg Intravenous Given 10/08/15 1123)     MDM   Final diagnoses:  Ileus following gastrointestinal surgery    Patient's exam is concerning for the possibility of ileus versus obstruction. Obstruction is less likely and that he is not having any vomiting and is not having any vomiting and is only having minimal pain. I am concerned however about the persistent swelling. I've spoken with general surgery who will see him in the emergency department. Plan on labs and plain x-rays and we will reassess.  Xrays concerning for possible obstruction although clinically more consistent with ileus.  Pt will be admitted by general surgery.    Linwood Dibbles,  MD 10/08/15 2036

## 2015-10-08 NOTE — ED Notes (Signed)
Report attempted x3

## 2015-10-08 NOTE — ED Notes (Signed)
General surgery PA at bedside 

## 2015-10-08 NOTE — ED Notes (Signed)
Unable to void at this time.

## 2015-10-08 NOTE — ED Notes (Signed)
Report attempted x2. Nurse to call back.  

## 2015-10-09 ENCOUNTER — Observation Stay (HOSPITAL_COMMUNITY): Payer: Self-pay

## 2015-10-09 MED ORDER — PHENOL 1.4 % MT LIQD
1.0000 | OROMUCOSAL | Status: DC | PRN
  Administered 2015-10-09: 1 via OROMUCOSAL

## 2015-10-09 NOTE — Progress Notes (Signed)
  Subjective: No real change, no flatus or BM  Objective: Vital signs in last 24 hours: Temp:  [98.4 F (36.9 C)-99.1 F (37.3 C)] 98.7 F (37.1 C) (04/30 0614) Pulse Rate:  [91-109] 107 (04/30 0614) Resp:  [16-18] 18 (04/30 0614) BP: (119-144)/(68-98) 144/91 mmHg (04/30 0614) SpO2:  [95 %-100 %] 95 % (04/30 0614) Weight:  [82.6 kg (182 lb 1.6 oz)] 82.6 kg (182 lb 1.6 oz) (04/29 1430) Last BM Date: 10/07/15 178 PO 1500 IV 800 urine Afebrile, VSS BP up some   NO labs Film is pending Intake/Output from previous day: 04/29 0701 - 04/30 0700 In: 1709.7 [P.O.:178; I.V.:1531.7] Out: 800 [Urine:800] Intake/Output this shift:    General appearance: alert, cooperative and no distress GI: distended, sore, BS a little hyperactive.  No flatus or BM  Lab Results:   Recent Labs  10/08/15 1052  WBC 8.8  HGB 13.5  HCT 40.9  PLT 321    BMET  Recent Labs  10/08/15 1052  NA 139  K 4.0  CL 101  CO2 24  GLUCOSE 119*  BUN 28*  CREATININE 1.17  CALCIUM 9.3   PT/INR No results for input(s): LABPROT, INR in the last 72 hours.   Recent Labs Lab 10/03/15 0744 10/08/15 1052  AST 25 18  ALT 20 17  ALKPHOS 51 54  BILITOT 1.1 1.5*  PROT 6.8 7.8  ALBUMIN 3.7 3.2*     Lipase     Component Value Date/Time   LIPASE 24 10/08/2015 1052     Studies/Results: Dg Abd Acute W/chest  10/08/2015  CLINICAL DATA:  Distended painful stomach. History of right inguinal hernia repair 5 days ago. EXAM: DG ABDOMEN ACUTE W/ 1V CHEST COMPARISON:  CT abdomen dated 10/03/2015. FINDINGS: Single view of the chest: Heart size is normal.  Lungs are clear. Supine and upright views of the abdomen: Distended small bowel loops within the upper abdomen and left abdomen, with associated air-fluid levels suggesting a mechanical small bowel obstruction. No evidence of free intraperitoneal air. IMPRESSION: 1. Findings suggest persistent mechanical small bowel obstruction. 2. Lungs are clear and there is  no evidence of acute cardiopulmonary abnormality Electronically Signed   By: Bary RichardStan  Maynard M.D.   On: 10/08/2015 11:23    Medications: . enoxaparin (LOVENOX) injection  40 mg Subcutaneous Q24H    Assessment/Plan Ileus vs SBO s/p RIH repair with mesh 10/03/15, Dr. Luisa Hartornett Ongoing scrotal swelling and edema FEN: IV fluids/ice chips ID:  None VTE:  Lovenox/SCD      Plan:  Film is pending, but if no better may need to proceed with NG placement.  I warned him this may be our recommendation later today.       Sherrie GeorgeJENNINGS,Caryssa Elzey 10/09/2015 [phone]

## 2015-10-10 ENCOUNTER — Inpatient Hospital Stay (HOSPITAL_COMMUNITY): Payer: Self-pay

## 2015-10-10 LAB — BASIC METABOLIC PANEL
Anion gap: 10 (ref 5–15)
BUN: 22 mg/dL — AB (ref 6–20)
CO2: 26 mmol/L (ref 22–32)
CREATININE: 1.16 mg/dL (ref 0.61–1.24)
Calcium: 9.1 mg/dL (ref 8.9–10.3)
Chloride: 107 mmol/L (ref 101–111)
GFR calc Af Amer: >60 mL/min (ref >60)
Glucose, Bld: 108 mg/dL — ABNORMAL HIGH (ref 65–99)
POTASSIUM: 4.4 mmol/L (ref 3.5–5.1)
SODIUM: 143 mmol/L (ref 135–145)

## 2015-10-10 LAB — MAGNESIUM: MAGNESIUM: 2.2 mg/dL (ref 1.7–2.4)

## 2015-10-10 MED ORDER — DIATRIZOATE MEGLUMINE & SODIUM 66-10 % PO SOLN
90.0000 mL | Freq: Once | ORAL | Status: AC
  Administered 2015-10-10: 90 mL via NASOGASTRIC
  Filled 2015-10-10 (×2): qty 90

## 2015-10-10 NOTE — Progress Notes (Signed)
Subjective: No real change, no flatus, No BM, he feels better with NG, but he is still very distended.    Objective: Vital signs in last 24 hours: Temp:  [97.5 F (36.4 C)-98.7 F (37.1 C)] 98.7 F (37.1 C) (05/01 0416) Pulse Rate:  [93-102] 93 (05/01 0416) Resp:  [18-19] 19 (05/01 0416) BP: (140-147)/(80-85) 140/82 mmHg (05/01 0416) SpO2:  [95 %-96 %] 96 % (05/01 0416) Last BM Date: 10/07/15 580 PO 2650 per NG 1275 urine Afebrile, VSS Labs OK Film this AM shows no improvement:  Persistent marked gaseous distension of small bowel which is insignificantly changed compared to prior. The maximal diameter of the dilated small bowel again measures 5.3 cm. No massive free air on this single portable supine view.  Intake/Output from previous day: 04/30 0701 - 05/01 0700 In: 3010 [P.O.:580; I.V.:2400; NG/GT:30] Out: 3925 [Urine:1275; Emesis/NG output:2650] Intake/Output this shift:    General appearance: alert, cooperative and no distress Resp: clear to auscultation bilaterally GI: still distended, no BS, no Flatus, no BM  Lab Results:   Recent Labs  10/08/15 1052  WBC 8.8  HGB 13.5  HCT 40.9  PLT 321    BMET  Recent Labs  10/08/15 1052 10/10/15 0730  NA 139 143  K 4.0 4.4  CL 101 107  CO2 24 26  GLUCOSE 119* 108*  BUN 28* 22*  CREATININE 1.17 1.16  CALCIUM 9.3 9.1   PT/INR No results for input(s): LABPROT, INR in the last 72 hours.   Recent Labs Lab 10/08/15 1052  AST 18  ALT 17  ALKPHOS 54  BILITOT 1.5*  PROT 7.8  ALBUMIN 3.2*     Lipase     Component Value Date/Time   LIPASE 24 10/08/2015 1052     Studies/Results: Dg Abd 1 View  10/10/2015  CLINICAL DATA:  45 year old male with abdominal tenderness and small bowel obstruction following recent hernia repair EXAM: ABDOMEN - 1 VIEW COMPARISON:  Prior abdominal radiographs 10/09/2015 FINDINGS: Persistent marked gaseous distension of small bowel which is insignificantly changed compared to  prior. The maximal diameter of the dilated small bowel again measures 5.3 cm. No massive free air on this single portable supine view. The bony structures are unremarkable. Phleboliths project over the left anatomic pelvis. IMPRESSION: Persistent and unchanged appearance of probable small bowel obstruction. Maximal dilatation of the dilated small bowel loops stable at 5.3 cm in the right upper quadrant. Electronically Signed   By: Malachy Moan M.D.   On: 10/10/2015 07:56   Dg Abd Acute W/chest  10/08/2015  CLINICAL DATA:  Distended painful stomach. History of right inguinal hernia repair 5 days ago. EXAM: DG ABDOMEN ACUTE W/ 1V CHEST COMPARISON:  CT abdomen dated 10/03/2015. FINDINGS: Single view of the chest: Heart size is normal.  Lungs are clear. Supine and upright views of the abdomen: Distended small bowel loops within the upper abdomen and left abdomen, with associated air-fluid levels suggesting a mechanical small bowel obstruction. No evidence of free intraperitoneal air. IMPRESSION: 1. Findings suggest persistent mechanical small bowel obstruction. 2. Lungs are clear and there is no evidence of acute cardiopulmonary abnormality Electronically Signed   By: Bary Richard M.D.   On: 10/08/2015 11:23   Dg Abd Portable 2v  10/09/2015  CLINICAL DATA:  45 year old male with history of right inguinal hernia surgery 6 days ago. Distended abdomen. EXAM: PORTABLE ABDOMEN - 2 VIEW COMPARISON:  Abdominal radiograph 10/08/2015. FINDINGS: Numerous gas-filled loops of small bowel measuring up to 5.3 cm  in the upper abdomen. Numerous air-fluid levels on the lateral decubitus view. No pneumoperitoneum. There is a paucity of colonic and rectal gas noted. IMPRESSION: 1. Findings remain concerning for probable partial small bowel obstruction. 2. No pneumoperitoneum. Electronically Signed   By: Trudie Reedaniel  Entrikin M.D.   On: 10/09/2015 10:29    Medications: . enoxaparin (LOVENOX) injection  40 mg Subcutaneous Q24H     Assessment/Plan  Ileus vs SBO s/p RIH repair with mesh 10/03/15, Dr. Luisa Hartornett  POD 7 Ongoing scrotal swelling and edema FEN: IV fluids/ice chips ID: None VTE: Lovenox/SCD    Plan:  I think he is obstructed and we will start SB protocol this AM.  NPO after MN this AM for possible surgery if he does not open up by tomorrow.      LOS: 1 day    Jeremy Singh 10/10/2015 [phone]

## 2015-10-11 ENCOUNTER — Inpatient Hospital Stay (HOSPITAL_COMMUNITY): Payer: Self-pay

## 2015-10-11 NOTE — Progress Notes (Signed)
Subjective: He is looking tired, scrotum still fairly swollen but better.  His abdomen is still very distended but a little better. NG was clamped because port was occluded, I have fixed that.    Objective: Vital signs in last 24 hours: Temp:  [99.3 F (37.4 C)] 99.3 F (37.4 C) (05/02 0536) Pulse Rate:  [95-98] 96 (05/02 0536) Resp:  [17-18] 17 (05/02 0536) BP: (146-148)/(83-91) 146/91 mmHg (05/02 0536) SpO2:  [95 %-97 %] 95 % (05/02 0536) Last BM Date: 11/05/14 (per pt) NG 2375 Urine 1925 BM x 1 Afebrile, VSS No labs Film this AM Proximal and mid small bowel dilatation again identified. Example loop at 3.9 cm today versus 4.3 cm yesterday. No pneumatosis or free intraperitoneal air. Relative paucity of colonic gas. There is stool within the ascending colon. Probable phleboliths in the left hemipelvis. Film SBP:  No contrast found, with ongoing SBO pattern Intake/Output from previous day: 05/01 0701 - 05/02 0700 In: 1740 [P.O.:230; I.V.:1360; NG/GT:150] Out: 4300 [Urine:1925; Emesis/NG output:2375] Intake/Output this shift:    General appearance: alert, cooperative and no distress Resp: clear to auscultation bilaterally GI: softer, but still distended.  he did have a BM yesterday, and he said it was normal  Male genitalia: scrotum still pretty distended.  Perhaps a little less than on admit.  Lab Results:   Recent Labs  10/08/15 1052  WBC 8.8  HGB 13.5  HCT 40.9  PLT 321    BMET  Recent Labs  10/08/15 1052 10/10/15 0730  NA 139 143  K 4.0 4.4  CL 101 107  CO2 24 26  GLUCOSE 119* 108*  BUN 28* 22*  CREATININE 1.17 1.16  CALCIUM 9.3 9.1   PT/INR No results for input(s): LABPROT, INR in the last 72 hours.   Recent Labs Lab 10/08/15 1052  AST 18  ALT 17  ALKPHOS 54  BILITOT 1.5*  PROT 7.8  ALBUMIN 3.2*     Lipase     Component Value Date/Time   LIPASE 24 10/08/2015 1052     Studies/Results: Dg Abd 1 View  10/11/2015  CLINICAL DATA:   Followup of small bowel obstruction. EXAM: ABDOMEN - 1 VIEW COMPARISON:  Plain film 1 day prior.  CT 10/03/2015 FINDINGS: Single supine view abdomen and pelvis. Proximal and mid small bowel dilatation again identified. Example loop at 3.9 cm today versus 4.3 cm yesterday. No pneumatosis or free intraperitoneal air. Relative paucity of colonic gas. There is stool within the ascending colon. Probable phleboliths in the left hemipelvis. IMPRESSION: Slight improvement in small bowel obstruction pattern. Electronically Signed   By: Jeronimo GreavesKyle  Talbot M.D.   On: 10/11/2015 07:56   Dg Abd 1 View  10/10/2015  CLINICAL DATA:  45 year old male with abdominal tenderness and small bowel obstruction following recent hernia repair EXAM: ABDOMEN - 1 VIEW COMPARISON:  Prior abdominal radiographs 10/09/2015 FINDINGS: Persistent marked gaseous distension of small bowel which is insignificantly changed compared to prior. The maximal diameter of the dilated small bowel again measures 5.3 cm. No massive free air on this single portable supine view. The bony structures are unremarkable. Phleboliths project over the left anatomic pelvis. IMPRESSION: Persistent and unchanged appearance of probable small bowel obstruction. Maximal dilatation of the dilated small bowel loops stable at 5.3 cm in the right upper quadrant. Electronically Signed   By: Malachy MoanHeath  McCullough M.D.   On: 10/10/2015 07:56   Dg Abd Portable 1v-small Bowel Obstruction Protocol-initial, 8 Hr Delay  10/10/2015  CLINICAL DATA:  Small  bowel obstruction EXAM: PORTABLE ABDOMEN - 1 VIEW COMPARISON:  Earlier same day FINDINGS: Enteric tube tip and side port again projected the expected location of the gastric fundus. Persistent gaseous distention of multiple loops of small bowel with index loop within the left mid and abdomen measuring approximately 4.3 cm in diameter. Nondiagnostic evaluation for pneumoperitoneum secondary to supine positioning and exclusion of the lower thorax. No  pneumatosis or portal venous gas. No acute osseus abnormalities. IMPRESSION: 1. Enteric tube tip and side port again project over the gastric fundus. 2. Similar findings worrisome for small bowel obstruction. Electronically Signed   By: Simonne Come M.D.   On: 10/10/2015 22:06   Dg Abd Portable 1v-small Bowel Protocol-position Verification  10/10/2015  CLINICAL DATA:  Check NG tube position. EXAM: PORTABLE ABDOMEN - 1 VIEW COMPARISON:  10/10/2015 FINDINGS: NG tube coils in the mid stomach with the tip in the fundus. Continued small bowel prominence compatible with small bowel obstruction. No free air organomegaly. IMPRESSION: NG tube coils in the stomach with the tip in the fundus. Continued small bowel obstruction pattern. Electronically Signed   By: Charlett Nose M.D.   On: 10/10/2015 09:40    Medications: . enoxaparin (LOVENOX) injection  40 mg Subcutaneous Q24H   . sodium chloride 125 mL/hr at 10/08/15 1131  . 0.9 % NaCl with KCl 20 mEq / L 100 mL/hr at 10/10/15 2247   Assessment/Plan Ileus vs SBO s/p RIH repair with mesh 10/03/15, Dr. Luisa Hart POD 8 Ongoing scrotal swelling and edema FEN: IV fluids/ice chips ID: None VTE: Lovenox/SCD     Plan:  Dr. Harlon Flor will review and decide on surgery.     LOS: 2 days    Ernesteen Mihalic 10/11/2015 [id number]

## 2015-10-12 ENCOUNTER — Inpatient Hospital Stay (HOSPITAL_COMMUNITY): Payer: MEDICAID | Admitting: Certified Registered"

## 2015-10-12 ENCOUNTER — Encounter (HOSPITAL_COMMUNITY): Admission: EM | Disposition: A | Discharge status: Home / Self Care | Payer: Self-pay | Source: Home / Self Care

## 2015-10-12 ENCOUNTER — Encounter (HOSPITAL_COMMUNITY): Payer: Self-pay | Admitting: Certified Registered"

## 2015-10-12 HISTORY — PX: INSERTION OF MESH: SHX5868

## 2015-10-12 HISTORY — PX: LAPAROSCOPY: SHX197

## 2015-10-12 HISTORY — PX: INGUINAL HERNIA REPAIR: SHX194

## 2015-10-12 LAB — BASIC METABOLIC PANEL
ANION GAP: 7 (ref 5–15)
BUN: 18 mg/dL (ref 6–20)
CHLORIDE: 108 mmol/L (ref 101–111)
CO2: 26 mmol/L (ref 22–32)
Calcium: 8.9 mg/dL (ref 8.9–10.3)
Creatinine, Ser: 1.08 mg/dL (ref 0.61–1.24)
GFR calc non Af Amer: >60 mL/min (ref >60)
Glucose, Bld: 96 mg/dL (ref 65–99)
POTASSIUM: 4.4 mmol/L (ref 3.5–5.1)
SODIUM: 141 mmol/L (ref 135–145)

## 2015-10-12 LAB — CBC
HCT: 34 % — ABNORMAL LOW (ref 39.0–52.0)
HEMOGLOBIN: 11.1 g/dL — AB (ref 13.0–17.0)
MCH: 30.6 pg (ref 26.0–34.0)
MCHC: 32.6 g/dL (ref 30.0–36.0)
MCV: 93.7 fL (ref 78.0–100.0)
Platelets: 361 10*3/uL (ref 150–400)
RBC: 3.63 MIL/uL — AB (ref 4.22–5.81)
RDW: 12.8 % (ref 11.5–15.5)
WBC: 6.8 10*3/uL (ref 4.0–10.5)

## 2015-10-12 LAB — PROTIME-INR
INR: 1.23 (ref 0.00–1.49)
PROTHROMBIN TIME: 15.7 s — AB (ref 11.6–15.2)

## 2015-10-12 SURGERY — LAPAROSCOPY, DIAGNOSTIC
Anesthesia: General | Site: Groin | Laterality: Right

## 2015-10-12 MED ORDER — SUCCINYLCHOLINE CHLORIDE 20 MG/ML IJ SOLN
INTRAMUSCULAR | Status: DC | PRN
  Administered 2015-10-12: 140 mg via INTRAVENOUS

## 2015-10-12 MED ORDER — LIDOCAINE HCL (CARDIAC) 20 MG/ML IV SOLN
INTRAVENOUS | Status: DC | PRN
  Administered 2015-10-12: 100 mg via INTRAVENOUS

## 2015-10-12 MED ORDER — MIDAZOLAM HCL 2 MG/2ML IJ SOLN
INTRAMUSCULAR | Status: AC
  Filled 2015-10-12: qty 2

## 2015-10-12 MED ORDER — FENTANYL CITRATE (PF) 100 MCG/2ML IJ SOLN
INTRAMUSCULAR | Status: DC | PRN
  Administered 2015-10-12: 50 ug via INTRAVENOUS
  Administered 2015-10-12: 100 ug via INTRAVENOUS
  Administered 2015-10-12: 150 ug via INTRAVENOUS
  Administered 2015-10-12 (×2): 50 ug via INTRAVENOUS

## 2015-10-12 MED ORDER — ONDANSETRON HCL 4 MG/2ML IJ SOLN
INTRAMUSCULAR | Status: DC | PRN
  Administered 2015-10-12: 4 mg via INTRAVENOUS

## 2015-10-12 MED ORDER — CEFAZOLIN SODIUM-DEXTROSE 2-4 GM/100ML-% IV SOLN
2.0000 g | Freq: Once | INTRAVENOUS | Status: AC
  Administered 2015-10-12: 2 g via INTRAVENOUS

## 2015-10-12 MED ORDER — ROCURONIUM BROMIDE 100 MG/10ML IV SOLN
INTRAVENOUS | Status: DC | PRN
  Administered 2015-10-12 (×2): 10 mg via INTRAVENOUS
  Administered 2015-10-12: 40 mg via INTRAVENOUS

## 2015-10-12 MED ORDER — FENTANYL CITRATE (PF) 250 MCG/5ML IJ SOLN
INTRAMUSCULAR | Status: AC
  Filled 2015-10-12: qty 5

## 2015-10-12 MED ORDER — SODIUM CHLORIDE 0.9 % IR SOLN
Status: DC | PRN
  Administered 2015-10-12: 1000 mL

## 2015-10-12 MED ORDER — ROCURONIUM BROMIDE 50 MG/5ML IV SOLN
INTRAVENOUS | Status: AC
  Filled 2015-10-12: qty 1

## 2015-10-12 MED ORDER — SUGAMMADEX SODIUM 200 MG/2ML IV SOLN
INTRAVENOUS | Status: DC | PRN
  Administered 2015-10-12: 200 mg via INTRAVENOUS

## 2015-10-12 MED ORDER — BUPIVACAINE-EPINEPHRINE (PF) 0.25% -1:200000 IJ SOLN
INTRAMUSCULAR | Status: AC
  Filled 2015-10-12: qty 30

## 2015-10-12 MED ORDER — ONDANSETRON HCL 4 MG/2ML IJ SOLN
INTRAMUSCULAR | Status: AC
  Filled 2015-10-12: qty 2

## 2015-10-12 MED ORDER — 0.9 % SODIUM CHLORIDE (POUR BTL) OPTIME
TOPICAL | Status: DC | PRN
  Administered 2015-10-12: 1000 mL

## 2015-10-12 MED ORDER — DEXAMETHASONE SODIUM PHOSPHATE 10 MG/ML IJ SOLN
INTRAMUSCULAR | Status: DC | PRN
  Administered 2015-10-12: 10 mg via INTRAVENOUS

## 2015-10-12 MED ORDER — BUPIVACAINE-EPINEPHRINE 0.25% -1:200000 IJ SOLN
INTRAMUSCULAR | Status: DC | PRN
  Administered 2015-10-12: 6 mL

## 2015-10-12 MED ORDER — CEFAZOLIN SODIUM-DEXTROSE 2-4 GM/100ML-% IV SOLN
INTRAVENOUS | Status: AC
  Filled 2015-10-12: qty 100

## 2015-10-12 MED ORDER — DEXAMETHASONE SODIUM PHOSPHATE 10 MG/ML IJ SOLN
INTRAMUSCULAR | Status: AC
  Filled 2015-10-12: qty 1

## 2015-10-12 MED ORDER — MEPERIDINE HCL 25 MG/ML IJ SOLN: 6.2500 mg | INTRAMUSCULAR | Status: DC | PRN

## 2015-10-12 MED ORDER — LACTATED RINGERS IV SOLN
INTRAVENOUS | Status: DC
  Administered 2015-10-12 (×2): via INTRAVENOUS

## 2015-10-12 MED ORDER — HEMOSTATIC AGENTS (NO CHARGE) OPTIME
TOPICAL | Status: DC | PRN
  Administered 2015-10-12 (×2): 1 via TOPICAL

## 2015-10-12 MED ORDER — SUGAMMADEX SODIUM 200 MG/2ML IV SOLN
INTRAVENOUS | Status: AC
  Filled 2015-10-12: qty 2

## 2015-10-12 MED ORDER — HYDROMORPHONE HCL 1 MG/ML IJ SOLN: 0.2500 mg | INTRAMUSCULAR | Status: DC | PRN

## 2015-10-12 MED ORDER — PROPOFOL 10 MG/ML IV BOLUS
INTRAVENOUS | Status: DC | PRN
  Administered 2015-10-12: 160 mg via INTRAVENOUS

## 2015-10-12 SURGICAL SUPPLY — 71 items
BENZOIN TINCTURE PRP APPL 2/3 (GAUZE/BANDAGES/DRESSINGS) ×5 IMPLANT
BLADE SURG 10 STRL SS (BLADE) ×5 IMPLANT
BLADE SURG ROTATE 9660 (MISCELLANEOUS) IMPLANT
CANISTER SUCTION 2500CC (MISCELLANEOUS) IMPLANT
CHLORAPREP W/TINT 26ML (MISCELLANEOUS) ×5 IMPLANT
CLOSURE WOUND 1/2 X4 (GAUZE/BANDAGES/DRESSINGS) ×1
COVER SURGICAL LIGHT HANDLE (MISCELLANEOUS) ×5 IMPLANT
DRAIN PENROSE 1/2X12 LTX STRL (WOUND CARE) ×5 IMPLANT
DRAPE LAPAROSCOPIC ABDOMINAL (DRAPES) IMPLANT
DRAPE WARM FLUID 44X44 (DRAPE) IMPLANT
DRSG OPSITE POSTOP 4X10 (GAUZE/BANDAGES/DRESSINGS) IMPLANT
DRSG OPSITE POSTOP 4X8 (GAUZE/BANDAGES/DRESSINGS) IMPLANT
DRSG TEGADERM 2-3/8X2-3/4 SM (GAUZE/BANDAGES/DRESSINGS) ×15 IMPLANT
DRSG TEGADERM 4X4.75 (GAUZE/BANDAGES/DRESSINGS) ×5 IMPLANT
ELECT BLADE 6.5 EXT (BLADE) ×5 IMPLANT
ELECT CAUTERY BLADE 6.4 (BLADE) ×5 IMPLANT
ELECT REM PT RETURN 9FT ADLT (ELECTROSURGICAL) ×5
ELECTRODE REM PT RTRN 9FT ADLT (ELECTROSURGICAL) ×3 IMPLANT
GAUZE SPONGE 2X2 8PLY STRL LF (GAUZE/BANDAGES/DRESSINGS) ×3 IMPLANT
GAUZE SPONGE 4X4 12PLY STRL (GAUZE/BANDAGES/DRESSINGS) ×5 IMPLANT
GLOVE BIO SURGEON STRL SZ7 (GLOVE) ×5 IMPLANT
GLOVE BIO SURGEON STRL SZ7.5 (GLOVE) ×5 IMPLANT
GLOVE BIOGEL PI IND STRL 7.0 (GLOVE) ×3 IMPLANT
GLOVE BIOGEL PI IND STRL 7.5 (GLOVE) ×3 IMPLANT
GLOVE BIOGEL PI INDICATOR 7.0 (GLOVE) ×2
GLOVE BIOGEL PI INDICATOR 7.5 (GLOVE) ×2
GLOVE SURG SS PI 6.5 STRL IVOR (GLOVE) ×5 IMPLANT
GOWN STRL REUS W/ TWL LRG LVL3 (GOWN DISPOSABLE) ×12 IMPLANT
GOWN STRL REUS W/TWL LRG LVL3 (GOWN DISPOSABLE) ×8
HEMOSTAT ARISTA ABSORB 3G PWDR (MISCELLANEOUS) ×10 IMPLANT
KIT BASIN OR (CUSTOM PROCEDURE TRAY) ×5 IMPLANT
KIT ROOM TURNOVER OR (KITS) ×5 IMPLANT
LIGASURE IMPACT 36 18CM CVD LR (INSTRUMENTS) IMPLANT
MESH PARIETEX PROGRIP RIGHT (Mesh General) ×5 IMPLANT
NS IRRIG 1000ML POUR BTL (IV SOLUTION) ×5 IMPLANT
PACK GENERAL/GYN (CUSTOM PROCEDURE TRAY) IMPLANT
PAD ARMBOARD 7.5X6 YLW CONV (MISCELLANEOUS) ×10 IMPLANT
PENCIL BUTTON HOLSTER BLD 10FT (ELECTRODE) ×5 IMPLANT
SET IRRIG TUBING LAPAROSCOPIC (IRRIGATION / IRRIGATOR) ×5 IMPLANT
SLEEVE ENDOPATH XCEL 5M (ENDOMECHANICALS) ×5 IMPLANT
SPECIMEN JAR LARGE (MISCELLANEOUS) IMPLANT
SPONGE GAUZE 2X2 STER 10/PKG (GAUZE/BANDAGES/DRESSINGS) ×2
SPONGE LAP 18X18 X RAY DECT (DISPOSABLE) ×5 IMPLANT
STAPLER VISISTAT 35W (STAPLE) IMPLANT
STRIP CLOSURE SKIN 1/2X4 (GAUZE/BANDAGES/DRESSINGS) ×4 IMPLANT
SUCTION POOLE TIP (SUCTIONS) IMPLANT
SUT MNCRL AB 4-0 PS2 18 (SUTURE) ×10 IMPLANT
SUT PDS AB 1 TP1 96 (SUTURE) IMPLANT
SUT PROLENE 2 0 CT2 30 (SUTURE) ×5 IMPLANT
SUT SILK 2 0 SH (SUTURE) ×5 IMPLANT
SUT SILK 2 0 SH CR/8 (SUTURE) IMPLANT
SUT SILK 2 0 TIES 10X30 (SUTURE) IMPLANT
SUT SILK 3 0 SH CR/8 (SUTURE) ×5 IMPLANT
SUT SILK 3 0 TIES 10X30 (SUTURE) IMPLANT
SUT VIC AB 0 CT2 27 (SUTURE) ×5 IMPLANT
SUT VIC AB 2-0 CT1 27 (SUTURE) ×2
SUT VIC AB 2-0 CT1 TAPERPNT 27 (SUTURE) ×3 IMPLANT
SUT VIC AB 3-0 SH 18 (SUTURE) IMPLANT
SUT VIC AB 3-0 SH 27 (SUTURE) ×2
SUT VIC AB 3-0 SH 27XBRD (SUTURE) ×3 IMPLANT
SYR BULB IRRIGATION 50ML (SYRINGE) ×5 IMPLANT
TOWEL OR 17X24 6PK STRL BLUE (TOWEL DISPOSABLE) ×5 IMPLANT
TOWEL OR 17X26 10 PK STRL BLUE (TOWEL DISPOSABLE) IMPLANT
TRAY FOLEY CATH 16FRSI W/METER (SET/KITS/TRAYS/PACK) IMPLANT
TRAY LAPAROSCOPIC MC (CUSTOM PROCEDURE TRAY) ×5 IMPLANT
TROCAR XCEL BLUNT TIP 100MML (ENDOMECHANICALS) ×5 IMPLANT
TROCAR XCEL NON-BLD 5MMX100MML (ENDOMECHANICALS) ×5 IMPLANT
TUBE CONNECTING 12'X1/4 (SUCTIONS) ×1
TUBE CONNECTING 12X1/4 (SUCTIONS) ×4 IMPLANT
TUBING INSUFFLATION (TUBING) ×5 IMPLANT
YANKAUER SUCT BULB TIP NO VENT (SUCTIONS) ×5 IMPLANT

## 2015-10-12 NOTE — Progress Notes (Signed)
Pharmacy: TPN Consult  TPN ordered past cut-off time per policy.   Plan: Labs ordered for AM TPN will be started tomorrow.   Link SnufferJessica Trason Shifflet, PharmD, BCPS Clinical Pharmacist [phone] 10/12/2015, 4:27 PM

## 2015-10-12 NOTE — Progress Notes (Signed)
Pt returned to 6N05 via bed from PACU.  Pt on 2L O2 via [place].  Pt has NGT to lt nare to ILWS.  Pt has abd lap sites X 3 with gauze and tegaderm.  Pt has gauze and tegaderm to rt groin.  Pt has 20G to rt FA with fluids infusing.  SCDs in place.  Report rcvd from RagsdaleJenna, Charity fundraiserN.  Pt has no complaints at the moment.  Will continue to monitor.

## 2015-10-12 NOTE — Anesthesia Preprocedure Evaluation (Addendum)
Anesthesia Evaluation  Patient identified by MRN, date of birth, ID band Patient awake    Reviewed: Allergy & Precautions, NPO status , Patient's Chart, lab work & pertinent test results  Airway Mallampati: III  TM Distance: >3 FB Neck ROM: Full    Dental  (+) Teeth Intact, Dental Advisory Given   Pulmonary neg pulmonary ROS,    breath sounds clear to auscultation       Cardiovascular negative cardio ROS   Rhythm:Regular Rate:Normal     Neuro/Psych negative neurological ROS  negative psych ROS   GI/Hepatic negative GI ROS, Neg liver ROS,   Endo/Other  negative endocrine ROS  Renal/GU negative Renal ROS     Musculoskeletal negative musculoskeletal ROS (+)   Abdominal   Peds  Hematology negative hematology ROS (+)   Anesthesia Other Findings   Reproductive/Obstetrics negative OB ROS                           Lab Results  Component Value Date   WBC 6.8 10/12/2015   HGB 11.1* 10/12/2015   HCT 34.0* 10/12/2015   MCV 93.7 10/12/2015   PLT 361 10/12/2015   Lab Results  Component Value Date   CREATININE 1.08 10/12/2015   BUN 18 10/12/2015   NA 141 10/12/2015   K 4.4 10/12/2015   CL 108 10/12/2015   CO2 26 10/12/2015   Lab Results  Component Value Date   INR 1.23 10/12/2015   INR 1.00 10/03/2015     Anesthesia Physical  Anesthesia Plan  ASA: II  Anesthesia Plan: General   Post-op Pain Management:    Induction: Intravenous, Rapid sequence and Cricoid pressure planned  Airway Management Planned: Oral ETT  Additional Equipment:   Intra-op Plan:   Post-operative Plan: Extubation in OR  Informed Consent: I have reviewed the patients History and Physical, chart, labs and discussed the procedure including the risks, benefits and alternatives for the proposed anesthesia with the patient or authorized representative who has indicated his/her understanding and acceptance.    Dental advisory given  Plan Discussed with: CRNA  Anesthesia Plan Comments:      Anesthesia Quick Evaluation

## 2015-10-12 NOTE — Transfer of Care (Signed)
Immediate Anesthesia Transfer of Care Note  Patient: Jeremy Singh  Procedure(s) Performed: Procedure(s): DIAGNOSTIC LAPAROSCOPY (N/A) RIGHT INGUINAL HERNIA REPAIR (Right) INSERTION OF MESH (Right)  Patient Location: PACU  Anesthesia Type:General  Level of Consciousness: awake, oriented and patient cooperative  Airway & Oxygen Therapy: Patient Spontanous Breathing and Patient connected to face mask oxygen  Post-op Assessment: Report given to RN, Post -op Vital signs reviewed and stable and Patient moving all extremities  Post vital signs: Reviewed and stable  Last Vitals:  Filed Vitals:   10/12/15 0539 10/12/15 1036  BP: 146/87 139/82  Pulse: 105 93  Temp: 37.2 C 37.4 C  Resp: 18 18    Last Pain:  Filed Vitals:   10/12/15 1037  PainSc: 4       Patients Stated Pain Goal: 2 (10/11/15 1634)  Complications: No apparent anesthesia complications

## 2015-10-12 NOTE — Progress Notes (Signed)
Day of Surgery  Subjective: 45 yo M s/p repair of incarcerated inguinal hernia now with SBO.  Attempted non operative management with NG tube without resolution.  Passed small BM and flatus yesterday however, still having high Ng output (>2L)  Objective: Vital signs in last 24 hours: Temp:  [98.9 F (37.2 C)] 98.9 F (37.2 C) (05/03 0539) Pulse Rate:  [90-105] 105 (05/03 0539) Resp:  [18] 18 (05/03 0539) BP: (139-146)/(77-87) 146/87 mmHg (05/03 0539) SpO2:  [98 %] 98 % (05/03 0539) Last BM Date: 10/11/15 (small)  Intake/Output from previous day: 05/02 0701 - 05/03 0700 In: 2668.3 [P.O.:290; I.V.:2378.3] Out: 3625 [Urine:900; Emesis/NG output:2725] Intake/Output this shift:    General appearance: alert, cooperative and no distress GI: normal findings: Soft, Nontender. and abnormal findings:  distended and Ng with bilious output  Lab Results:  No results for input(s): WBC, HGB, HCT, PLT in the last 72 hours. BMET  Recent Labs  10/10/15 0730  NA 143  K 4.4  CL 107  CO2 26  GLUCOSE 108*  BUN 22*  CREATININE 1.16  CALCIUM 9.1   PT/INR No results for input(s): LABPROT, INR in the last 72 hours. ABG No results for input(s): PHART, HCO3 in the last 72 hours.  Invalid input(s): PCO2, PO2  Studies/Results: Dg Abd 1 View  10/11/2015  CLINICAL DATA:  Followup of small bowel obstruction. EXAM: ABDOMEN - 1 VIEW COMPARISON:  Plain film 1 day prior.  CT 10/03/2015 FINDINGS: Single supine view abdomen and pelvis. Proximal and mid small bowel dilatation again identified. Example loop at 3.9 cm today versus 4.3 cm yesterday. No pneumatosis or free intraperitoneal air. Relative paucity of colonic gas. There is stool within the ascending colon. Probable phleboliths in the left hemipelvis. IMPRESSION: Slight improvement in small bowel obstruction pattern. Electronically Signed   By: Jeronimo Greaves M.D.   On: 10/11/2015 07:56   Dg Abd Portable 1v-small Bowel Obstruction Protocol-initial, 8  Hr Delay  10/10/2015  CLINICAL DATA:  Small bowel obstruction EXAM: PORTABLE ABDOMEN - 1 VIEW COMPARISON:  Earlier same day FINDINGS: Enteric tube tip and side port again projected the expected location of the gastric fundus. Persistent gaseous distention of multiple loops of small bowel with index loop within the left mid and abdomen measuring approximately 4.3 cm in diameter. Nondiagnostic evaluation for pneumoperitoneum secondary to supine positioning and exclusion of the lower thorax. No pneumatosis or portal venous gas. No acute osseus abnormalities. IMPRESSION: 1. Enteric tube tip and side port again project over the gastric fundus. 2. Similar findings worrisome for small bowel obstruction. Electronically Signed   By: Simonne Come M.D.   On: 10/10/2015 22:06   Dg Abd Portable 1v-small Bowel Protocol-position Verification  10/10/2015  CLINICAL DATA:  Check NG tube position. EXAM: PORTABLE ABDOMEN - 1 VIEW COMPARISON:  10/10/2015 FINDINGS: NG tube coils in the mid stomach with the tip in the fundus. Continued small bowel prominence compatible with small bowel obstruction. No free air organomegaly. IMPRESSION: NG tube coils in the stomach with the tip in the fundus. Continued small bowel obstruction pattern. Electronically Signed   By: Charlett Nose M.D.   On: 10/10/2015 09:40    Anti-infectives: Anti-infectives    None      Assessment/Plan: s/p Procedure(s): LAPAROSCOPY DIAGNOSTIC (N/A) EXPLORATORY LAPAROTOMY (N/A) Ng with continued high output.  Will plan for OR today for diagnostic laparoscopy and possible laparotomy.    The surgical procedure has been discussed with the patient.  Potential risks, benefits, alternative treatments,  and expected outcomes have been explained.  All of the patient's questions at this time have been answered.  The likelihood of reaching the patient's treatment goal is good.  The patient understand the proposed surgical procedure and wishes to proceed.    LOS: 3 days     Gladine Plude K. 10/12/2015

## 2015-10-12 NOTE — Op Note (Addendum)
Diagnostic laparoscopy/ right inguinal hernia repair with mesh  Indications: The patient presented with a history of a right, incarcerated inguinal hernia.  He underwent repair of the hernia 8 days ago. The hernia spontaneously reduced just prior to surgery. It had been present for many years. It was very large. He had a large indirect hernia that was repaired with mesh.  However, his right scrotum has swollen again. He has signs of a bowel obstruction that did not seem to be resolving. Therefore we recommended diagnostic laparoscopy with possible exploratory laparotomy to relieve the bowel obstruction.  Pre-operative Diagnosis: Right recurrent inguinal hernia Post-operative Diagnosis: same  Surgeon: Wynona LunaSUEI,Amyra Vantuyl K.   Assistants: Madelin RearJosh Rickey, MD  Anesthesia: General endotracheal anesthesia  ASA Class: 2  Procedure Details  The patient was seen again in the Holding Room. The risks, benefits, complications, treatment options, and expected outcomes were discussed with the patient. The possibilities of reaction to medication, pulmonary aspiration, perforation of viscus, bleeding, recurrent infection, the need for additional procedures, and development of a complication requiring transfusion or further operation were discussed with the patient and/or family. The likelihood of success in repairing the hernia and returning the patient to their previous functional status is good.  There was concurrence with the proposed plan, and informed consent was obtained. The site of surgery was properly noted/marked. The patient was taken to the Operating Room, identified as Jeremy Singh, and the procedure verified as diagnostic laparoscopy. A Time Out was held and the above information confirmed.  The patient was placed in the supine position and underwent induction of anesthesia. The abdomen and groin was prepped with Chloraprep and draped in the standard fashion.  We made a 1 cm vertical incision above the  umbilicus.  Dissection was carried down the fascia. We incised the fascia vertically and entered the peritoneal cavity. Some thin peritoneal fluid was encountered. We placed a pursestring suture of 0 Vicryl around the fascial opening. The Hassan cannula was inserted and secured with the stay suture.  We insufflated CO2 maintaining a maximum pressure of 15 mmHg. The laparoscope was inserted and the patient was positioned in Trendelenburg tilted to his left. The small bowel is quite distended. A 5 mm port was placed in the right upper quadrant and another 5 mm port was placed in the left lower quadrant. We moved the camera to the right upper quadrant port site. Glassman clamps were then used to manipulate the small bowel. We examined the right lower quadrant and there is a mass of small bowel that has herniated down through indirect hernia defect. We attempted to reduce this with external pressure and internal traction. However it became obvious that we were not able reduce this. We then decided to explore his groin.  We opened his incision and dissected down to the external oblique fascia. The Vicryl suture and the external oblique fascia was removed. We then encountered the mesh. The mesh is secured with multiple interrupted 2-0 Prolene sutures. The mesh seem to be in good placement but there was an obvious recurrence of the hernia through the internal ring. We removed the Prolene sutures and remove the mesh. There is a large amount of bowel that is herniating down into the right hemiscrotum. We were able to reduce this entire hernia sac into the field. We open the hernia sac and encountered some free fluid. There are several loops of small bowel that are very thickened and inflamed with an obvious transition point at the neck of the  hernia sac. The loops of bowel are adherent to each other and to the sides of the hernia sac. We were able to dissect the bowel away from the hernia sac. We carefully examined the  bowel and there is no sign of perforation. We examined the small bowel proximal distal to the area of obstruction and this appears to be normal. The transition point seems to be patent. The serosa of the small bowel was quite irritated and had diffuse oozing. We applied Arista to the surface of the small bowel serosa which aided in hemostasis. We enlarged our fascial defect slightly. We were able to reduce the small bowel back in the peritoneal cavity. We dissected the neck of the hernia sac away from the spermatic cord. We ligated the hernia sac with a 2-0 silk suture and amputated muscle hernia sac. The stump was reduced back through the internal ring. We tightened the internal ring with 0 Vicryl. There is no sign of direct hernia defect.  We used a right-sided Progrip mesh which was inserted and deployed across the floor of the inguinal canal. The mesh was tucked underneath the external oblique fascia laterally.  The flap of the mesh was closed around the spermatic cord to recreate the internal inguinal ring.  The mesh was secured to the pubic tubercle with 2-0 Prolene.  The external oblique fascia was reapproximated with 2-0 Vicryl.  3-0 Vicryl was used to close the subcutaneous tissues and 4-0 Monocryl was used to close the skin in subcuticular fashion.   We reinsufflated CO2 and inserted the laparoscope. The hernia defect seems to be closed. The small bowel is irritated but seems to be free of the hernia. There is no sign of active bleeding. The laparoscopic ports are removed. Those incisions were closed with 4-0 Monocryl. Benzoin and steri-strips were used to seal the incision.  A clean dressing was applied.  The patient was then extubated and brought to the recovery room in stable condition.  All sponge, instrument, and needle counts were correct prior to closure and at the conclusion of the case.   Estimated Blood Loss: less than 100 mL                 Complications: None; patient tolerated the  procedure well.         Disposition: PACU - hemodynamically stable.         Condition: stable  Wilmon Arms. Corliss Skains, MD, [facility]  General/ Trauma Surgery  10/12/2015 2:13 PM

## 2015-10-12 NOTE — Anesthesia Procedure Notes (Signed)
Procedure Name: Intubation Date/Time: 10/12/2015 11:54 AM Performed by: Burna CashONRAD, Baptiste Littler C Pre-anesthesia Checklist: Patient identified, Emergency Drugs available, Suction available and Patient being monitored Patient Re-evaluated:Patient Re-evaluated prior to inductionOxygen Delivery Method: Circle System Utilized Preoxygenation: Pre-oxygenation with 100% oxygen Intubation Type: IV induction Ventilation: Mask ventilation without difficulty Laryngoscope Size: Mac and 4 Tube type: Oral Tube size: 8.0 mm Number of attempts: 1 Airway Equipment and Method: Stylet and Oral airway Placement Confirmation: ETT inserted through vocal cords under direct vision,  positive ETCO2 and breath sounds checked- equal and bilateral Secured at: 22 cm Tube secured with: Tape Dental Injury: Teeth and Oropharynx as per pre-operative assessment

## 2015-10-13 ENCOUNTER — Encounter (HOSPITAL_COMMUNITY): Payer: Self-pay | Admitting: Surgery

## 2015-10-13 LAB — CBC
HCT: 33.3 % — ABNORMAL LOW (ref 39.0–52.0)
Hemoglobin: 10.5 g/dL — ABNORMAL LOW (ref 13.0–17.0)
MCH: 29.7 pg (ref 26.0–34.0)
MCHC: 31.5 g/dL (ref 30.0–36.0)
MCV: 94.3 fL (ref 78.0–100.0)
PLATELETS: 361 10*3/uL (ref 150–400)
RBC: 3.53 MIL/uL — AB (ref 4.22–5.81)
RDW: 12.9 % (ref 11.5–15.5)
WBC: 9.3 10*3/uL (ref 4.0–10.5)

## 2015-10-13 LAB — COMPREHENSIVE METABOLIC PANEL
ALT: 17 U/L (ref 17–63)
AST: 21 U/L (ref 15–41)
Albumin: 2.5 g/dL — ABNORMAL LOW (ref 3.5–5.0)
Alkaline Phosphatase: 38 U/L (ref 38–126)
Anion gap: 10 (ref 5–15)
BUN: 15 mg/dL (ref 6–20)
CHLORIDE: 106 mmol/L (ref 101–111)
CO2: 26 mmol/L (ref 22–32)
Calcium: 8.5 mg/dL — ABNORMAL LOW (ref 8.9–10.3)
Creatinine, Ser: 1.05 mg/dL (ref 0.61–1.24)
Glucose, Bld: 106 mg/dL — ABNORMAL HIGH (ref 65–99)
POTASSIUM: 4.9 mmol/L (ref 3.5–5.1)
SODIUM: 142 mmol/L (ref 135–145)
Total Bilirubin: 1.5 mg/dL — ABNORMAL HIGH (ref 0.3–1.2)
Total Protein: 6.2 g/dL — ABNORMAL LOW (ref 6.5–8.1)

## 2015-10-13 LAB — DIFFERENTIAL
BASOS ABS: 0 10*3/uL (ref 0.0–0.1)
Basophils Relative: 0 %
EOS ABS: 0.1 10*3/uL (ref 0.0–0.7)
Eosinophils Relative: 1 %
Lymphocytes Relative: 13 %
Lymphs Abs: 1.2 10*3/uL (ref 0.7–4.0)
MONO ABS: 1.4 10*3/uL — AB (ref 0.1–1.0)
Monocytes Relative: 15 %
NEUTROS PCT: 71 %
Neutro Abs: 6.6 10*3/uL (ref 1.7–7.7)

## 2015-10-13 LAB — GLUCOSE, CAPILLARY: Glucose-Capillary: 113 mg/dL — ABNORMAL HIGH (ref 65–99)

## 2015-10-13 LAB — TRIGLYCERIDES: TRIGLYCERIDES: 99 mg/dL (ref <150)

## 2015-10-13 LAB — MAGNESIUM: MAGNESIUM: 2.1 mg/dL (ref 1.7–2.4)

## 2015-10-13 LAB — PHOSPHORUS: Phosphorus: 3.2 mg/dL (ref 2.5–4.6)

## 2015-10-13 LAB — PREALBUMIN: PREALBUMIN: 7.9 mg/dL — AB (ref 18–38)

## 2015-10-13 MED ORDER — POTASSIUM CHLORIDE IN NACL 20-0.9 MEQ/L-% IV SOLN
INTRAVENOUS | Status: AC
  Administered 2015-10-13 – 2015-10-14 (×2): via INTRAVENOUS
  Filled 2015-10-13 (×2): qty 1000

## 2015-10-13 MED ORDER — FAT EMULSION 20 % IV EMUL
120.0000 mL | INTRAVENOUS | Status: AC
  Administered 2015-10-13: 120 mL via INTRAVENOUS
  Filled 2015-10-13: qty 200

## 2015-10-13 MED ORDER — INSULIN ASPART 100 UNIT/ML ~~LOC~~ SOLN
0.0000 [IU] | SUBCUTANEOUS | Status: DC
  Administered 2015-10-15 – 2015-10-16 (×3): 1 [IU] via SUBCUTANEOUS

## 2015-10-13 MED ORDER — TRACE MINERALS CR-CU-MN-SE-ZN 10-1000-500-60 MCG/ML IV SOLN
INTRAVENOUS | Status: AC
  Administered 2015-10-13: 18:00:00 via INTRAVENOUS
  Filled 2015-10-13: qty 960

## 2015-10-13 MED ORDER — SODIUM CHLORIDE 0.9% FLUSH
10.0000 mL | INTRAVENOUS | Status: DC | PRN
  Administered 2015-10-13 – 2015-10-15 (×3): 10 mL
  Filled 2015-10-13 (×3): qty 40

## 2015-10-13 NOTE — Progress Notes (Signed)
PARENTERAL NUTRITION CONSULT NOTE - INITIAL  Pharmacy Consult for TPN Indication: Prolonged ileus  No Known Allergies  Patient Measurements: Height: 5\' 6"  (167.6 cm) Weight: 182 lb 1.6 oz (82.6 kg) IBW/kg (Calculated) : 63.8 Adjusted Body Weight:  Usual Weight:   Vital Signs: Temp: 97.4 F (36.3 C) (05/04 0951) Temp Source: Oral (05/04 0951) BP: 117/90 mmHg (05/04 0951) Pulse Rate: 133 (05/04 0951) Intake/Output from previous day: 05/03 0701 - 05/04 0700 In: 3591.7 [I.V.:3561.7; NG/GT:30] Out: 1700 [Urine:1200; Emesis/NG output:450; Blood:50] Intake/Output from this shift:    Labs:  Recent Labs  10/12/15 0754 10/13/15 0538  WBC 6.8 9.3  HGB 11.1* 10.5*  HCT 34.0* 33.3*  PLT 361 361  INR 1.23  --      Recent Labs  10/12/15 0754 10/13/15 0538 10/13/15 0539  NA 141 142  --   K 4.4 4.9  --   CL 108 106  --   CO2 26 26  --   GLUCOSE 96 106*  --   BUN 18 15  --   CREATININE 1.08 1.05  --   CALCIUM 8.9 8.5*  --   MG  --  2.1  --   PHOS  --  3.2  --   PROT  --  6.2*  --   ALBUMIN  --  2.5*  --   AST  --  21  --   ALT  --  17  --   ALKPHOS  --  38  --   BILITOT  --  1.5*  --   PREALBUMIN  --  7.9*  --   TRIG  --   --  99   Estimated Creatinine Clearance: 88.7 mL/min (by C-G formula based on Cr of 1.05).   No results for input(s): GLUCAP in the last 72 hours.  Medical History: Past Medical History  Diagnosis Date  . Abscess of back     multiple other sites    Medications:  Scheduled:  . enoxaparin (LOVENOX) injection  40 mg Subcutaneous Q24H    Insulin Requirements in the past 24 hours:  No insulin orders  Current Nutrition:  NPO  Nutritional Goals:  F/u RD consult  Assessment: 45yo male s/p repair of R-inguinal hernia on 4/24 and d/c'd home the same day.  He was readmitted 4/29 distended abdomen, (-)flatus and ileus, with minimal po intake since discharge on 4/24.  He returned to OR on 5/3 for repair of recurrent R-inguinal hernia with  mesh placement.  He has been NPO/ice chips since admission and is to start TPN.  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  (-)N/V, BS.  Abd distended.  NG with 250mL out Endo:  No hx DM.  Serum gluc 106 Lytes:  Na 142, K 4.9, Mg 2.1, Phos 3.2.  NS + 20K at 14100ml/hr Renal:  Scrotal edema, sling in place.  UOP 0.576ml/kg/hr.  I/O 800 Pulm:  RA Cards:  BP wnl, hr a bit tachy Hepatobil:   TG 99, PAlb 7.9 Neuro:  Tylenol and Morphine for pain control ID:   WBC wnl.  S/P Cefazolin for surgical prophylaxis  Best Practices:  SCDs, Enox TPN Access:  PICC 5/3 TPN start date:  5/4 >>  Plan:  Clinimix-E at 1040ml/hr with ILE at 565ml/hr Decrease IVF to 5160ml/hr to maintain current total fluid of 15900ml/hr MVI and TE added to TPN  TPN labs in AM and q Mon/Thurs Sensitive SSI q4   Marisue Humble, PharmD Clinical Pharmacist [facility]

## 2015-10-13 NOTE — Anesthesia Postprocedure Evaluation (Signed)
Anesthesia Post Note  Patient: Salvadore DomDavid L Daponte  Procedure(s) Performed: Procedure(s) (LRB): DIAGNOSTIC LAPAROSCOPY (N/A) RIGHT INGUINAL HERNIA REPAIR (Right) INSERTION OF MESH (Right)  Patient location during evaluation: PACU Anesthesia Type: General and Regional Level of consciousness: awake and alert Pain management: pain level controlled Vital Signs Assessment: post-procedure vital signs reviewed and stable Respiratory status: spontaneous breathing, nonlabored ventilation, respiratory function stable and patient connected to nasal cannula oxygen Cardiovascular status: blood pressure returned to baseline and stable Postop Assessment: no signs of nausea or vomiting Anesthetic complications: no    Last Vitals:  Filed Vitals:   10/13/15 0157 10/13/15 0523  BP: 131/76 138/76  Pulse: 96 107  Temp: 37.2 C 37.3 C  Resp: 18 18    Last Pain:  Filed Vitals:   10/13/15 0656  PainSc: 5                  Cecile HearingStephen Edward Seanmichael Salmons

## 2015-10-13 NOTE — Progress Notes (Signed)
Patient ID: Jeremy Singh, male   DOB: [birth year], 45 y.o.   MRN: [id number]     [address] [place], [place] [address]    Phone: [phone] FAX: [phone]     Subjective: 263m ngt output, no flatus. No n/v. Pressure with urinating.  No sob, cp. Afebrile.  VSS.    Objective:  Vital signs:  Filed Vitals:   10/12/15 1759 10/12/15 2210 10/13/15 0157 10/13/15 0523  BP: 140/82 130/76 131/76 138/76  Pulse: 91 92 96 107  Temp: 99.1 F (37.3 C) 98.6 F (37 C) 99 F (37.2 C) 99.1 F (37.3 C)  TempSrc: Oral Oral Oral Oral  Resp: '18 18 18 18  '$ Height:      Weight:      SpO2: 100% 97% 97% 94%    Last BM Date: 10/11/15 (small bm)  Intake/Output   Yesterday:  05/03 0701 - 05/04 0700 In: 3591.7 [I.V.:3561.7; NG/GT:30] Out: 1700 [Urine:1200; Emesis/NG output:450; Blood:50] This shift:    I/O last 3 completed shifts: In: 6020 [P.O.:50; I.V.:5940; NG/GT:30] Out: 31594[Urine:1800; Emesis/NG output:1725; Blood:50]    Physical Exam: General: Pt awake/alert/oriented x4 in no acute distress Chest: cta. No chest wall pain w good excursion CV:  Pulses intact.  Regular rhythm Abdomen: no bowel sounds, abdomen is distended.  Dressing dry to incisions.  Scrotal edema with jock strap in place Ext:  SCDs BLE.  No mjr edema.  No cyanosis Skin: No petechiae / purpura   Problem List:   Active Problems:   Ileus following gastrointestinal surgery    Results:   Labs: Results for orders placed or performed during the hospital encounter of 10/08/15 (from the past 48 hour(s))  CBC     Status: Abnormal   Collection Time: 10/12/15  7:54 AM  Result Value Ref Range   WBC 6.8 4.0 - 10.5 K/uL   RBC 3.63 (L) 4.22 - 5.81 MIL/uL   Hemoglobin 11.1 (L) 13.0 - 17.0 g/dL   HCT 34.0 (L) 39.0 - 52.0 %   MCV 93.7 78.0 - 100.0 fL   MCH 30.6 26.0 - 34.0 pg   MCHC 32.6 30.0 - 36.0 g/dL   RDW 12.8 11.5 - 15.5 %   Platelets 361  150 - 400 K/uL  Basic metabolic panel     Status: None   Collection Time: 10/12/15  7:54 AM  Result Value Ref Range   Sodium 141 135 - 145 mmol/L   Potassium 4.4 3.5 - 5.1 mmol/L   Chloride 108 101 - 111 mmol/L   CO2 26 22 - 32 mmol/L   Glucose, Bld 96 65 - 99 mg/dL   BUN 18 6 - 20 mg/dL   Creatinine, Ser 1.08 0.61 - 1.24 mg/dL   Calcium 8.9 8.9 - 10.3 mg/dL   GFR calc non Af Amer >60 >60 mL/min   GFR calc Af Amer >60 >60 mL/min    Comment: (NOTE) The eGFR has been calculated using the CKD EPI equation. This calculation has not been validated in all clinical situations. eGFR's persistently <60 mL/min signify possible Chronic Kidney Disease.    Anion gap 7 5 - 15  Protime-INR     Status: Abnormal   Collection Time: 10/12/15  7:54 AM  Result Value Ref Range   Prothrombin Time 15.7 (H) 11.6 - 15.2 seconds   INR 1.23 0.00 - 1.49  Comprehensive metabolic panel     Status: Abnormal  Collection Time: 10/13/15  5:38 AM  Result Value Ref Range   Sodium 142 135 - 145 mmol/L   Potassium 4.9 3.5 - 5.1 mmol/L   Chloride 106 101 - 111 mmol/L   CO2 26 22 - 32 mmol/L   Glucose, Bld 106 (H) 65 - 99 mg/dL   BUN 15 6 - 20 mg/dL   Creatinine, Ser 1.05 0.61 - 1.24 mg/dL   Calcium 8.5 (L) 8.9 - 10.3 mg/dL   Total Protein 6.2 (L) 6.5 - 8.1 g/dL   Albumin 2.5 (L) 3.5 - 5.0 g/dL   AST 21 15 - 41 U/L   ALT 17 17 - 63 U/L   Alkaline Phosphatase 38 38 - 126 U/L   Total Bilirubin 1.5 (H) 0.3 - 1.2 mg/dL   GFR calc non Af Amer >60 >60 mL/min   GFR calc Af Amer >60 >60 mL/min    Comment: (NOTE) The eGFR has been calculated using the CKD EPI equation. This calculation has not been validated in all clinical situations. eGFR's persistently <60 mL/min signify possible Chronic Kidney Disease.    Anion gap 10 5 - 15  Prealbumin     Status: Abnormal   Collection Time: 10/13/15  5:38 AM  Result Value Ref Range   Prealbumin 7.9 (L) 18 - 38 mg/dL  Magnesium     Status: None   Collection Time:  10/13/15  5:38 AM  Result Value Ref Range   Magnesium 2.1 1.7 - 2.4 mg/dL  Phosphorus     Status: None   Collection Time: 10/13/15  5:38 AM  Result Value Ref Range   Phosphorus 3.2 2.5 - 4.6 mg/dL  CBC     Status: Abnormal   Collection Time: 10/13/15  5:38 AM  Result Value Ref Range   WBC 9.3 4.0 - 10.5 K/uL   RBC 3.53 (L) 4.22 - 5.81 MIL/uL   Hemoglobin 10.5 (L) 13.0 - 17.0 g/dL   HCT 33.3 (L) 39.0 - 52.0 %   MCV 94.3 78.0 - 100.0 fL   MCH 29.7 26.0 - 34.0 pg   MCHC 31.5 30.0 - 36.0 g/dL   RDW 12.9 11.5 - 15.5 %   Platelets 361 150 - 400 K/uL  Differential     Status: None (Preliminary result)   Collection Time: 10/13/15  5:38 AM  Result Value Ref Range   Neutrophils Relative % PENDING %   Neutro Abs PENDING 1.7 - 7.7 K/uL   Band Neutrophils PENDING %   Lymphocytes Relative PENDING %   Lymphs Abs PENDING 0.7 - 4.0 K/uL   Monocytes Relative PENDING %   Monocytes Absolute PENDING 0.1 - 1.0 K/uL   Eosinophils Relative PENDING %   Eosinophils Absolute PENDING 0.0 - 0.7 K/uL   Basophils Relative PENDING %   Basophils Absolute PENDING 0.0 - 0.1 K/uL   WBC Morphology PENDING    RBC Morphology PENDING    Smear Review PENDING    nRBC PENDING 0 /100 WBC   Metamyelocytes Relative PENDING %   Myelocytes PENDING %   Promyelocytes Absolute PENDING %   Blasts PENDING %  Triglycerides     Status: None   Collection Time: 10/13/15  5:39 AM  Result Value Ref Range   Triglycerides 99 <150 mg/dL    Imaging / Studies: No results found.  Medications / Allergies:  Scheduled Meds: . enoxaparin (LOVENOX) injection  40 mg Subcutaneous Q24H   Continuous Infusions: . sodium chloride 125 mL/hr at 10/08/15 1131  . 0.9 %  NaCl with KCl 20 mEq / L 100 mL/hr at 10/13/15 0124  . lactated ringers 10 mL/hr at 10/12/15 1126   PRN Meds:.acetaminophen **OR** acetaminophen, diphenhydrAMINE **OR** diphenhydrAMINE, morphine injection, ondansetron **OR** ondansetron (ZOFRAN) IV,  phenol  Antibiotics: Anti-infectives    Start     Dose/Rate Route Frequency Ordered Stop   10/12/15 1130  ceFAZolin (ANCEF) IVPB 2g/100 mL premix     2 g 200 mL/hr over 30 Minutes Intravenous  Once 10/12/15 1125 10/12/15 1155   10/12/15 1125  ceFAZolin (ANCEF) 2-4 GM/100ML-% IVPB    Comments:  Piercen, Covino   : cabinet override      10/12/15 1125 10/12/15 1550        Assessment/Plan S/p RIH repair 10/03/15 Dr. Brantley Stage   Recurrent RIH POD#1 diagnostic laparoscopy, right inguinal hernia repair with mesh--Dr. Georgette Dover -continue NGT, bowel rest, expected prolonged ileus.  Needs IS, mobilize, pain control.  Sling for scrotal swelling VTE prophylaxis-SCD/lovenox PCM-expected prolonged ileus, start TPN Dispo-ileus   Erby Pian, ANP-BC [facility] Pager [phone](7A-4:30P) For consults and floor pages call [phone](7A-4:30P)  10/13/2015 7:51 AM

## 2015-10-13 NOTE — Progress Notes (Signed)
Peripherally Inserted Central Catheter/Midline Placement  The IV Nurse has discussed with the patient and/or persons authorized to consent for the patient, the purpose of this procedure and the potential benefits and risks involved with this procedure.  The benefits include less needle sticks, lab draws from the catheter and patient may be discharged home with the catheter.  Risks include, but not limited to, infection, bleeding, blood clot (thrombus formation), and puncture of an artery; nerve damage and irregular heat beat.  Alternatives to this procedure were also discussed.  PICC/Midline Placement Documentation        Lisabeth DevoidGibbs, Prosper Paff Jeanette 10/13/2015, 8:50 AM Consent obtained by Lazarus Gowdaenee Newman, RN

## 2015-10-13 NOTE — Progress Notes (Signed)
Initial Nutrition Assessment  DOCUMENTATION CODES:   Not applicable  INTERVENTION:   -TPN management per pharmacy -RD will follow for diet advancement and supplement as appropriate  NUTRITION DIAGNOSIS:   Inadequate oral intake related to altered GI function as evidenced by NPO status.  GOAL:   Patient will meet greater than or equal to 90% of their needs  MONITOR:   Diet advancement, Labs, Weight trends, Skin, I & O's  REASON FOR ASSESSMENT:   Consult New TPN/TNA  ASSESSMENT:   Pt presented 10/03/15 with a significant amount of bowel in the scrotum and significant RIH. He was writhing in pain at the time of exam. He was seen and taken to the OR for repair of the hernia later that day. Post op he had allot of pain first post op evening, we got better control and he did well the following AM. He ate a regular breakfast, walked the halls and was ready for discharge later that first post op day. Since discharge, he hasn't really be able to eat. Abdomen is distended, no flatus, he vomited 4/26 x 2, but has not vomited since. He has had some flatus this AM, and this is the first time. He is very distended, but not really tender.  Pt admitted with ileus vs SBO s/p RIH repair with mesh 10/03/15.   S/p Procedure(s) (LRB) 10/12/15: DIAGNOSTIC LAPAROSCOPY (N/A) RIGHT INGUINAL HERNIA REPAIR (Right) INSERTION OF MESH (Right)  NGT remains in place (placed 10/09/15); noted 450 ml output within the past 24 houts. Per surgical notes, pt with prolonged ileus due to chronically obstructed small bowel.   Hx obtained from pt at bedside. He reveals his appetite is typically very good and is very active at baseline. 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 He suspects that he has lost some weight during this hospitalization, due to NPO status.   He reveals UBW is 185#. Noted wt stability, per wt hx.   Nutrition-Focused physical exam  completed. Findings are no fat depletion, no muscle depletion, and no edema.   Reviewed pharmacy note; plan to initiate Clinimix-E at 5240ml/hr with ILE at 665ml/hr at 1800. Regimen will provide 922 kcals and 48 grams protein, which meets 49% of estimated kcal needs and 46% of estimated protein needs).   Case discussed with RN. She confirms pt s/p PICC placement this AM with plans to initiate TPN at 1800.   Labs reviewed.   Diet Order:  Diet NPO time specified TPN (CLINIMIX-E) Adult  Skin:     Last BM:  10/11/15  Height:   Ht Readings from Last 1 Encounters:  10/08/15 5\' 6"  (1.676 m)    Weight:   Wt Readings from Last 1 Encounters:  10/08/15 182 lb 1.6 oz (82.6 kg)    Ideal Body Weight:  64.5 kg  BMI:  Body mass index is 29.41 kg/(m^2).  Estimated Nutritional Needs:   Kcal:  1900-2100  Protein:  105-120 grams  Fluid:  >1.9 L  EDUCATION NEEDS:   No education needs identified at this time  Jeremy Singh, RD, LDN, CDE Pager: [phone] After hours Pager: [phone]

## 2015-10-14 LAB — GLUCOSE, CAPILLARY
GLUCOSE-CAPILLARY: 115 mg/dL — AB (ref 65–99)
GLUCOSE-CAPILLARY: 117 mg/dL — AB (ref 65–99)
GLUCOSE-CAPILLARY: 119 mg/dL — AB (ref 65–99)
Glucose-Capillary: 114 mg/dL — ABNORMAL HIGH (ref 65–99)
Glucose-Capillary: 115 mg/dL — ABNORMAL HIGH (ref 65–99)
Glucose-Capillary: 101 mg/dL — ABNORMAL HIGH (ref 65–99)

## 2015-10-14 LAB — MAGNESIUM: Magnesium: 2 mg/dL (ref 1.7–2.4)

## 2015-10-14 LAB — BASIC METABOLIC PANEL
ANION GAP: 11 (ref 5–15)
BUN: 14 mg/dL (ref 6–20)
CALCIUM: 8.4 mg/dL — AB (ref 8.9–10.3)
CO2: 27 mmol/L (ref 22–32)
CREATININE: 1.14 mg/dL (ref 0.61–1.24)
Chloride: 102 mmol/L (ref 101–111)
Glucose, Bld: 119 mg/dL — ABNORMAL HIGH (ref 65–99)
Potassium: 4 mmol/L (ref 3.5–5.1)
SODIUM: 140 mmol/L (ref 135–145)

## 2015-10-14 LAB — PHOSPHORUS: PHOSPHORUS: 2.7 mg/dL (ref 2.5–4.6)

## 2015-10-14 MED ORDER — FAT EMULSION 20 % IV EMUL
240.0000 mL | INTRAVENOUS | Status: AC
  Administered 2015-10-14: 240 mL via INTRAVENOUS
  Filled 2015-10-14: qty 250

## 2015-10-14 MED ORDER — M.V.I. ADULT IV INJ
INJECTION | INTRAVENOUS | Status: AC
  Administered 2015-10-14: 18:00:00 via INTRAVENOUS
  Filled 2015-10-14: qty 1992

## 2015-10-14 MED ORDER — POTASSIUM CHLORIDE IN NACL 20-0.9 MEQ/L-% IV SOLN: INTRAVENOUS | Status: AC

## 2015-10-14 NOTE — Care Management Note (Signed)
Case Management Note  Patient Details  Name: Jeremy Singh MRN: [id number] Date of Birth: 06/23/1970  Subjective/Objective:                    Action/Plan:   Expected Discharge Date:                  Expected Discharge Plan:  Home/Self Care  In-House Referral:     Discharge planning Services  [facility]  Post Acute Care Choice:    Choice offered to:     DME Arranged:    DME Agency:     HH Arranged:    HH Agency:     Status of Service:  In process, will continue to follow  Medicare Important Message Given:    Date Medicare IM Given:    Medicare IM give by:    Date Additional Medicare IM Given:    Additional Medicare Important Message give by:     If discussed at Long Length of Stay Meetings, dates discussed:    Additional Comments:  Jeremy Singh, Jeremy Reeg Marie, RN 10/14/2015, 11:25 AM

## 2015-10-14 NOTE — Progress Notes (Signed)
Patient ID: Jeremy Singh, male   DOB: [birth year], 45 y.o.   MRN: [id number]     [facility]      [address] [place], [place] [address]    Phone: [phone] FAX: [phone]     Subjective: Feels much better. Walking in hallways. No n/v. 232m ngt output.  Started passing flatus. Voiding. Afebrile.  Vss.   Objective:  Vital signs:  Filed Vitals:   10/13/15 0951 10/13/15 1331 10/13/15 2256 10/14/15 0459  BP: 117/90 129/68 118/64 134/75  Pulse: 133 120 95 94  Temp: 97.4 F (36.3 C) 99.2 F (37.3 C) 99.6 F (37.6 C) 99 F (37.2 C)  TempSrc: Oral Oral Oral Oral  Resp: '18 18 18 18  '$ Height:      Weight:      SpO2: 95% 98% 98% 98%    Last BM Date: 11/01/15  Intake/Output   Yesterday:  05/04 0701 - 05/05 0700 In: 2440.9 [I.V.:1901; TPN:539.9] Out: 1480 [Urine:1230; Emesis/NG output:250] This shift:    I/O last 3 completed shifts: In: 3740.9 [I.V.:3201] Out: 2530 [Urine:2030; Emesis/NG output:500]     Physical Exam: General: Pt awake/alert/oriented x4 in no acute distress Chest: cta. No chest wall pain w good excursion CV: Pulses intact. Regular rhythm Abdomen: +BS,  abdomen is distended. Dressing dry to incisions. Scrotal edema with jock strap in place Ext: SCDs BLE. No mjr edema. No cyanosis Skin: No petechiae / purpura   Problem List:   Active Problems:   Ileus following gastrointestinal surgery    Results:   Labs: Results for orders placed or performed during the hospital encounter of 10/08/15 (from the past 48 hour(s))  Comprehensive metabolic panel     Status: Abnormal   Collection Time: 10/13/15  5:38 AM  Result Value Ref Range   Sodium 142 135 - 145 mmol/L   Potassium 4.9 3.5 - 5.1 mmol/L   Chloride 106 101 - 111 mmol/L   CO2 26 22 - 32 mmol/L   Glucose, Bld 106 (H) 65 - 99 mg/dL   BUN 15 6 - 20 mg/dL   Creatinine, Ser 1.05 0.61 - 1.24 mg/dL   Calcium 8.5 (L) 8.9 - 10.3 mg/dL   Total Protein 6.2 (L) 6.5 - 8.1 g/dL   Albumin 2.5 (L) 3.5 - 5.0 g/dL   AST 21 15 - 41 U/L   ALT 17 17 - 63 U/L   Alkaline Phosphatase 38 38 - 126 U/L   Total Bilirubin 1.5 (H) 0.3 - 1.2 mg/dL   GFR calc non Af Amer >60 >60 mL/min   GFR calc Af Amer >60 >60 mL/min    Comment: (NOTE) The eGFR has been calculated using the CKD EPI equation. This calculation has not been validated in all clinical situations. eGFR's persistently <60 mL/min signify possible Chronic Kidney Disease.    Anion gap 10 5 - 15  Prealbumin     Status: Abnormal   Collection Time: 10/13/15  5:38 AM  Result Value Ref Range   Prealbumin 7.9 (L) 18 - 38 mg/dL  Magnesium     Status: None   Collection Time: 10/13/15  5:38 AM  Result Value Ref Range   Magnesium 2.1 1.7 - 2.4 mg/dL  Phosphorus     Status: None   Collection Time: 10/13/15  5:38 AM  Result Value Ref Range   Phosphorus 3.2 2.5 - 4.6 mg/dL  CBC     Status: Abnormal   Collection Time: 10/13/15  5:38 AM  Result Value Ref Range   WBC 9.3 4.0 - 10.5 K/uL   RBC 3.53 (L) 4.22 - 5.81 MIL/uL   Hemoglobin 10.5 (L) 13.0 - 17.0 g/dL   HCT 33.3 (L) 39.0 - 52.0 %   MCV 94.3 78.0 - 100.0 fL   MCH 29.7 26.0 - 34.0 pg   MCHC 31.5 30.0 - 36.0 g/dL   RDW 12.9 11.5 - 15.5 %   Platelets 361 150 - 400 K/uL  Differential     Status: Abnormal   Collection Time: 10/13/15  5:38 AM  Result Value Ref Range   Neutrophils Relative % 71 %   Lymphocytes Relative 13 %   Monocytes Relative 15 %   Eosinophils Relative 1 %   Basophils Relative 0 %   Neutro Abs 6.6 1.7 - 7.7 K/uL   Lymphs Abs 1.2 0.7 - 4.0 K/uL   Monocytes Absolute 1.4 (H) 0.1 - 1.0 K/uL   Eosinophils Absolute 0.1 0.0 - 0.7 K/uL   Basophils Absolute 0.0 0.0 - 0.1 K/uL   WBC Morphology MILD LEFT SHIFT (1-5% METAS, OCC MYELO, OCC BANDS)     Comment: TOXIC GRANULATION  Triglycerides     Status: None   Collection Time: 10/13/15  5:39 AM  Result Value Ref Range   Triglycerides 99 <150 mg/dL  Glucose,  capillary     Status: Abnormal   Collection Time: 10/13/15  8:37 PM  Result Value Ref Range   Glucose-Capillary 113 (H) 65 - 99 mg/dL  Glucose, capillary     Status: Abnormal   Collection Time: 10/14/15 12:04 AM  Result Value Ref Range   Glucose-Capillary 117 (H) 65 - 99 mg/dL   Comment 1 Notify RN   Basic metabolic panel     Status: Abnormal   Collection Time: 10/14/15  5:13 AM  Result Value Ref Range   Sodium 140 135 - 145 mmol/L   Potassium 4.0 3.5 - 5.1 mmol/L    Comment: DELTA CHECK NOTED   Chloride 102 101 - 111 mmol/L   CO2 27 22 - 32 mmol/L   Glucose, Bld 119 (H) 65 - 99 mg/dL   BUN 14 6 - 20 mg/dL   Creatinine, Ser 1.14 0.61 - 1.24 mg/dL   Calcium 8.4 (L) 8.9 - 10.3 mg/dL   GFR calc non Af Amer >60 >60 mL/min   GFR calc Af Amer >60 >60 mL/min    Comment: (NOTE) The eGFR has been calculated using the CKD EPI equation. This calculation has not been validated in all clinical situations. eGFR's persistently <60 mL/min signify possible Chronic Kidney Disease.    Anion gap 11 5 - 15  Magnesium     Status: None   Collection Time: 10/14/15  5:13 AM  Result Value Ref Range   Magnesium 2.0 1.7 - 2.4 mg/dL  Phosphorus     Status: None   Collection Time: 10/14/15  5:13 AM  Result Value Ref Range   Phosphorus 2.7 2.5 - 4.6 mg/dL  Glucose, capillary     Status: Abnormal   Collection Time: 10/14/15  5:17 AM  Result Value Ref Range   Glucose-Capillary 114 (H) 65 - 99 mg/dL   Comment 1 Notify RN   Glucose, capillary     Status: Abnormal   Collection Time: 10/14/15  7:46 AM  Result Value Ref Range   Glucose-Capillary 115 (H) 65 - 99 mg/dL    Imaging / Studies: No results found.  Medications / Allergies:  Scheduled Meds: .  enoxaparin (LOVENOX) injection  40 mg Subcutaneous Q24H  . insulin aspart  0-9 Units Subcutaneous Q4H   Continuous Infusions: . 0.9 % NaCl with KCl 20 mEq / L 60 mL/hr at 10/13/15 2103  . Marland KitchenTPN (CLINIMIX-E) Adult 40 mL/hr at 10/13/15 1800   And  .  fat emulsion 120 mL (10/13/15 1801)   PRN Meds:.acetaminophen **OR** acetaminophen, diphenhydrAMINE **OR** diphenhydrAMINE, morphine injection, ondansetron **OR** ondansetron (ZOFRAN) IV, phenol, sodium chloride flush  Antibiotics: Anti-infectives    Start     Dose/Rate Route Frequency Ordered Stop   10/12/15 1130  ceFAZolin (ANCEF) IVPB 2g/100 mL premix     2 g 200 mL/hr over 30 Minutes Intravenous  Once 10/12/15 1125 10/12/15 1155   10/12/15 1125  ceFAZolin (ANCEF) 2-4 GM/100ML-% IVPB    Comments:  Cohan, [place]   : cabinet override      10/12/15 1125 10/12/15 1550        Assessment/Plan S/p RIH repair 10/03/15 Dr. Brantley Stage   Recurrent RIH POD#2 diagnostic laparoscopy, right inguinal hernia repair with mesh--Dr. Georgette Dover -despite passing flatus, continue NGT, bowel rest.  May consider clamping trial tomorrow -IS, mobilize, pain control. Sling for scrotal swelling VTE prophylaxis-SCD/lovenox PCM-TPN for expected prolonged ileus  Dispo-ileus    Erby Pian, ANP-BC [facility] Pager [phone](7A-4:30P) For consults and floor pages call [phone](7A-4:30P)  10/14/2015 8:55 AM

## 2015-10-14 NOTE — Progress Notes (Signed)
PARENTERAL NUTRITION CONSULT NOTE   Pharmacy Consult for TPN Indication: Prolonged ileus  No Known Allergies  Patient Measurements: Height:  (167.6 cm) Weight: 182 lb 1.6 oz (82.6 kg) IBW/kg (Calculated) : 63.8  Vital Signs: Temp: 99 F (37.2 C) (05/05 0459) Temp Source: Oral (05/05 0459) BP: 134/75 mmHg (05/05 0459) Pulse Rate: 94 (05/05 0459) Intake/Output from previous day: 05/04 0701 - 05/05 0700 In: 2440.9 [I.V.:1901; TPN:539.9] Out: 1480 [Urine:1230; Emesis/NG output:250] Intake/Output from this shift: Total I/O In: 210 [I.V.:120; TPN:90] Out: 0   Labs:  Recent Labs  10/12/15 0754 10/13/15 0538  WBC 6.8 9.3  HGB 11.1* 10.5*  HCT 34.0* 33.3*  PLT 361 361  INR 1.23  --      Recent Labs  10/12/15 0754 10/13/15 0538 10/13/15 0539 10/14/15 0513  NA 141 142  --  140  K 4.4 4.9  --  4.0  CL 108 106  --  102  CO2 26 26  --  27  GLUCOSE 96 106*  --  119*  BUN 18 15  --  14  CREATININE 1.08 1.05  --  1.14  CALCIUM 8.9 8.5*  --  8.4*  MG  --  2.1  --  2.0  PHOS  --  3.2  --  2.7  PROT  --  6.2*  --   --   ALBUMIN  --  2.5*  --   --   AST  --  21  --   --   ALT  --  17  --   --   ALKPHOS  --  38  --   --   BILITOT  --  1.5*  --   --   PREALBUMIN  --  7.9*  --   --   TRIG  --   --  99  --    Estimated Creatinine Clearance: 81.7 mL/min (by C-G formula based on Cr of 1.14).    Recent Labs  10/14/15 0004 10/14/15 0517 10/14/15 0746  GLUCAP 117* 114* 115*    Medical History: Past Medical History  Diagnosis Date  . Abscess of back     multiple other sites    Medications:  Scheduled:  . enoxaparin (LOVENOX) injection  40 mg Subcutaneous Q24H  . insulin aspart  0-9 Units Subcutaneous Q4H    Insulin Requirements in the past 24 hours:  0 units sensitive SSI   Assessment: 45yo male s/p repair of R-inguinal hernia on 4/24 and d/c'd home the same day.  He was readmitted 4/29 distended abdomen, (-)flatus and ileus, with minimal po intake  since discharge on 4/24.  He returned to OR on 5/3 for repair of recurrent R-inguinal hernia with mesh placement.  He has been NPO/ice chips since admission and is to start TPN.  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  (-)N/V, BS.  Abd distended.  NG with out. Passing flatus but continuing bowel rest. May consider clamping trial tomorrow  Endo:  No hx DM.  CBGs controlled  Lytes:  Na 140, K 4 (Goal >/=4), Mg 2 (Goal >/=2), Phos 2.7.  NS + 20K at 59ml/hr Renal:  Scrotal edema, sling in place.  UOP 0.62ml/kg/hr.  I/O + 2L Pulm:  RA Cards:  VSS Hepatobil:   TG 99, PAlb 7.9 Neuro:  Tylenol and Morphine for pain control ID:  WBC wnl.  S/P Cefazolin for surgical prophylaxis  Best Practices:  SCDs, Enox TPN Access:  PICC 5/3 TPN start date:  5/4 >>  Nutritional Goals (per RD recommendations):  Kcal: 1900-2100 Protein: 105-120 grams  Current Nutrition:  Clinimix-E at 2140ml/hr with ILE at 125ml/hr   Plan:  Increase Clinimix-E to 83 ml/hr with ILE at 10 ml/hr. This will provide 1894 Kcal and 100 gm of protein which is essentially at goal  [facility]  MVI and TE added to TPN TPN labs in AM and q Mon/Thurs Sensitive SSI q4  Vinnie LevelBenjamin Zena Vitelli, PharmD., BCPS Clinical Pharmacist Pager [phone]

## 2015-10-15 LAB — CREATININE, SERUM
Creatinine, Ser: 0.87 mg/dL (ref 0.61–1.24)
GFR calc non Af Amer: >60 mL/min (ref >60)

## 2015-10-15 LAB — GLUCOSE, CAPILLARY
GLUCOSE-CAPILLARY: 126 mg/dL — AB (ref 65–99)
GLUCOSE-CAPILLARY: 116 mg/dL — AB (ref 65–99)
Glucose-Capillary: 118 mg/dL — ABNORMAL HIGH (ref 65–99)
Glucose-Capillary: 119 mg/dL — ABNORMAL HIGH (ref 65–99)
Glucose-Capillary: 127 mg/dL — ABNORMAL HIGH (ref 65–99)
Glucose-Capillary: 84 mg/dL (ref 65–99)

## 2015-10-15 MED ORDER — FAT EMULSION 20 % IV EMUL
240.0000 mL | INTRAVENOUS | Status: DC
  Administered 2015-10-15: 240 mL via INTRAVENOUS
  Filled 2015-10-15: qty 250

## 2015-10-15 MED ORDER — TRACE MINERALS CR-CU-MN-SE-ZN 10-1000-500-60 MCG/ML IV SOLN
INTRAVENOUS | Status: DC
  Administered 2015-10-15: 18:00:00 via INTRAVENOUS
  Filled 2015-10-15: qty 1992

## 2015-10-15 NOTE — Progress Notes (Signed)
PARENTERAL NUTRITION CONSULT NOTE - FOLLOW UP  Pharmacy Consult for TPN Indication: Prolonged ileus  No Known Allergies  Patient Measurements: Height:  (167.6 cm) Weight: 182 lb 1.6 oz (82.6 kg) IBW/kg (Calculated) : 63.8 Adjusted Body Weight:  Usual Weight:   Vital Signs: Temp: 99.3 F (37.4 C) (05/06 0544) Temp Source: Oral (05/06 0544) BP: 137/77 mmHg (05/06 0544) Pulse Rate: 90 (05/06 0544) Intake/Output from previous day: 05/05 0701 - 05/06 0700 In: 1425.9 [I.V.:788; TPN:637.9] Out: 1350 [Urine:1200; Emesis/NG output:150] Intake/Output from this shift:    Labs:  Recent Labs  10/13/15 0538  WBC 9.3  HGB 10.5*  HCT 33.3*  PLT 361     Recent Labs  10/13/15 0538 10/13/15 0539 10/14/15 0513 10/15/15 0557  NA 142  --  140  --   K 4.9  --  4.0  --   CL 106  --  102  --   CO2 26  --  27  --   GLUCOSE 106*  --  119*  --   BUN 15  --  14  --   CREATININE 1.05  --  1.14 0.87  CALCIUM 8.5*  --  8.4*  --   MG 2.1  --  2.0  --   PHOS 3.2  --  2.7  --   PROT 6.2*  --   --   --   ALBUMIN 2.5*  --   --   --   AST 21  --   --   --   ALT 17  --   --   --   ALKPHOS 38  --   --   --   BILITOT 1.5*  --   --   --   PREALBUMIN 7.9*  --   --   --   TRIG  --  99  --   --    Estimated Creatinine Clearance: 107 mL/min (by C-G formula based on Cr of 0.87).    Recent Labs  10/15/15 0010 10/15/15 0403 10/15/15 0738  GLUCAP 119* 118* 126*    Medications:  Scheduled:  . enoxaparin (LOVENOX) injection  40 mg Subcutaneous Q24H  . insulin aspart  0-9 Units Subcutaneous Q4H    Insulin Requirements in the past 24 hours:  1 units sensitive SSI   Assessment: 45yo male s/p repair of R-inguinal hernia on 4/24 and d/c'd home the same day. He was readmitted 4/29 distended abdomen, (-)flatus and ileus, with minimal po intake since discharge on 4/24. He returned to OR on 5/3 for repair of recurrent R-inguinal hernia with mesh placement. He has been NPO/ice chips  since admission and is to start TPN.  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 (-)N/V, BS. Abd soft, less distended. NG with out. Passing flatus but continuing bowel rest. Clamping NG and trying CL diet today Endo: No hx DM. CBGs controlled  Lytes: Na 140, K 4 (Goal >/=4), Mg 2 (Goal >/=2), Phos 2.7. NS + 20K at 35ml/hr Renal: Scrotal edema, sling in place. Cr 0.87- improved.  UOP 0.67ml/kg/hr. I/O + 2L Pulm: RA Cards: VSS Hepatobil: TG 99, PAlb 7.9 Neuro: Tylenol and Morphine for pain control ID: WBC wnl, AFeb. S/P Cefazolin for surgical prophylaxis  Best Practices: SCDs, Enox TPN Access: PICC 5/3 TPN start date: 5/4 >>  Nutritional Goals (per RD recommendations):  Kcal: 1900-2100 Protein: 105-120 grams  Current Nutrition:  Clinimix-E at 240ml/hr with ILE at 305ml/hr   Plan:  Continue Clinimix-E to 83 ml/hr with ILE at 10 ml/hr. This will provide 1894 Kcal and 100 gm of protein which is essentially at goal  Decre[facility]F to [facility]  MVI and TE added to TPN TPN labs q Mon/Thurs D/C SSI   Marisue HumbleKendra Dolton Shaker, PharmD Clinical Pharmacist [facility]

## 2015-10-15 NOTE — Progress Notes (Signed)
Patient ID: Jeremy Singh, male   DOB: 12-30-1970, 45 y.o.   MRN: [id number]     [facility]      [address] [place], [place] [address]    Phone: [phone] FAX: [phone]     Subjective: Having BMs. No n/v. 12m ngt output.  Vss.  Afebrile. Good uop.    Objective:  Vital signs:  Filed Vitals:   10/14/15 0459 10/14/15 1338 10/14/15 2156 10/15/15 0544  BP: 134/75 118/64 132/73 137/77  Pulse: 94 97 95 90  Temp: 99 F (37.2 C) 99.1 F (37.3 C) 99.8 F (37.7 C) 99.3 F (37.4 C)  TempSrc: Oral Oral Oral Oral  Resp: '18 18 19 18  '$ Height:      Weight:      SpO2: 98% 99% 97% 99%    Last BM Date: 10/14/15  Intake/Output   Yesterday:  05/05 0701 - 05/06 0700 In: 1425.9 [I.V.:788; TPN:637.9] Out: 1350 [Urine:1200; Emesis/NG output:150] This shift: I/O last 3 completed shifts: In: 2966.8 [I.V.:1789] Out: 2180 [Urine:1980; Emesis/NG output:200]    Physical Exam: General: Pt awake/alert/oriented x4 in no acute distress Chest: cta. No chest wall pain w good excursion CV: Pulses intact. Regular rhythm Abdomen: +BS, abdomen is soft, less distended.. steri strips in place to incisions, no erythema or drainage. Scrotal edema with jock strap in place Ext: SCDs BLE. No mjr edema. No cyanosis Skin: No petechiae / purpura    Problem List:   Active Problems:   S/P right inguinal hernia repair 10/12/15    Results:   Labs: Results for orders placed or performed during the hospital encounter of 10/08/15 (from the past 48 hour(s))  Glucose, capillary     Status: Abnormal   Collection Time: 10/13/15  8:37 PM  Result Value Ref Range   Glucose-Capillary 113 (H) 65 - 99 mg/dL  Glucose, capillary     Status: Abnormal   Collection Time: 10/14/15 12:04 AM  Result Value Ref Range   Glucose-Capillary 117 (H) 65 - 99 mg/dL   Comment 1 Notify RN   Basic metabolic panel     Status: Abnormal   Collection Time: 10/14/15   5:13 AM  Result Value Ref Range   Sodium 140 135 - 145 mmol/L   Potassium 4.0 3.5 - 5.1 mmol/L    Comment: DELTA CHECK NOTED   Chloride 102 101 - 111 mmol/L   CO2 27 22 - 32 mmol/L   Glucose, Bld 119 (H) 65 - 99 mg/dL   BUN 14 6 - 20 mg/dL   Creatinine, Ser 1.14 0.61 - 1.24 mg/dL   Calcium 8.4 (L) 8.9 - 10.3 mg/dL   GFR calc non Af Amer >60 >60 mL/min   GFR calc Af Amer >60 >60 mL/min    Comment: (NOTE) The eGFR has been calculated using the CKD EPI equation. This calculation has not been validated in all clinical situations. eGFR's persistently <60 mL/min signify possible Chronic Kidney Disease.    Anion gap 11 5 - 15  Magnesium     Status: None   Collection Time: 10/14/15  5:13 AM  Result Value Ref Range   Magnesium 2.0 1.7 - 2.4 mg/dL  Phosphorus     Status: None   Collection Time: 10/14/15  5:13 AM  Result Value Ref Range   Phosphorus 2.7 2.5 - 4.6 mg/dL  Glucose, capillary     Status: Abnormal   Collection Time: 10/14/15  5:17 AM  Result Value Ref  Range   Glucose-Capillary 114 (H) 65 - 99 mg/dL   Comment 1 Notify RN   Glucose, capillary     Status: Abnormal   Collection Time: 10/14/15  7:46 AM  Result Value Ref Range   Glucose-Capillary 115 (H) 65 - 99 mg/dL  Glucose, capillary     Status: Abnormal   Collection Time: 10/14/15 11:34 AM  Result Value Ref Range   Glucose-Capillary 101 (H) 65 - 99 mg/dL  Glucose, capillary     Status: Abnormal   Collection Time: 10/14/15  3:54 PM  Result Value Ref Range   Glucose-Capillary 119 (H) 65 - 99 mg/dL  Glucose, capillary     Status: Abnormal   Collection Time: 10/14/15  8:09 PM  Result Value Ref Range   Glucose-Capillary 115 (H) 65 - 99 mg/dL  Glucose, capillary     Status: Abnormal   Collection Time: 10/15/15 12:10 AM  Result Value Ref Range   Glucose-Capillary 119 (H) 65 - 99 mg/dL  Glucose, capillary     Status: Abnormal   Collection Time: 10/15/15  4:03 AM  Result Value Ref Range   Glucose-Capillary 118 (H) 65 -  99 mg/dL  Creatinine, serum     Status: None   Collection Time: 10/15/15  5:57 AM  Result Value Ref Range   Creatinine, Ser 0.87 0.61 - 1.24 mg/dL   GFR calc non Af Amer >60 >60 mL/min   GFR calc Af Amer >60 >60 mL/min    Comment: (NOTE) The eGFR has been calculated using the CKD EPI equation. This calculation has not been validated in all clinical situations. eGFR's persistently <60 mL/min signify possible Chronic Kidney Disease.   Glucose, capillary     Status: Abnormal   Collection Time: 10/15/15  7:38 AM  Result Value Ref Range   Glucose-Capillary 126 (H) 65 - 99 mg/dL    Imaging / Studies: No results found.  Medications / Allergies:  Scheduled Meds: . enoxaparin (LOVENOX) injection  40 mg Subcutaneous Q24H  . insulin aspart  0-9 Units Subcutaneous Q4H   Continuous Infusions: . Marland KitchenTPN (CLINIMIX-E) Adult 83 mL/hr at 10/14/15 1754   And  . fat emulsion 240 mL (10/14/15 1754)  . 0.9 % NaCl with KCl 20 mEq / L 10 mL/hr at 10/14/15 1858   PRN Meds:.acetaminophen **OR** acetaminophen, diphenhydrAMINE **OR** diphenhydrAMINE, morphine injection, ondansetron **OR** ondansetron (ZOFRAN) IV, phenol, sodium chloride flush  Antibiotics: Anti-infectives    Start     Dose/Rate Route Frequency Ordered Stop   10/12/15 1130  ceFAZolin (ANCEF) IVPB 2g/100 mL premix     2 g 200 mL/hr over 30 Minutes Intravenous  Once 10/12/15 1125 10/12/15 1155   10/12/15 1125  ceFAZolin (ANCEF) 2-4 GM/100ML-% IVPB    Comments:  Hassen, Bruun   : cabinet override      10/12/15 1125 10/12/15 1550          Assessment/Plan S/p RIH repair 10/03/15 Dr. Brantley Stage   Recurrent RIH POD#3 diagnostic laparoscopy, right inguinal hernia repair with mesh--Dr. Georgette Dover -clamp ngt and allow for clears today -IS, mobilize, pain control. Sling for scrotal swelling VTE prophylaxis-SCD/lovenox PCM-TPN for expected prolonged ileus  Dispo-ileus   Erby Pian, ANP-BC [facility]   10/15/2015 8:54  AM

## 2015-10-16 LAB — GLUCOSE, CAPILLARY
GLUCOSE-CAPILLARY: 107 mg/dL — AB (ref 65–99)
GLUCOSE-CAPILLARY: 99 mg/dL (ref 65–99)
Glucose-Capillary: 109 mg/dL — ABNORMAL HIGH (ref 65–99)
Glucose-Capillary: 118 mg/dL — ABNORMAL HIGH (ref 65–99)
Glucose-Capillary: 124 mg/dL — ABNORMAL HIGH (ref 65–99)

## 2015-10-16 MED ORDER — OXYCODONE-ACETAMINOPHEN 5-325 MG PO TABS: 1.0000 | ORAL_TABLET | ORAL | Status: DC | PRN

## 2015-10-16 MED ORDER — IBUPROFEN 600 MG PO TABS: 600.0000 mg | ORAL_TABLET | Freq: Four times a day (QID) | ORAL | Status: DC | PRN

## 2015-10-16 MED ORDER — MORPHINE SULFATE (PF) 2 MG/ML IV SOLN
1.0000 mg | INTRAVENOUS | Status: DC | PRN
  Administered 2015-10-16: 2 mg via INTRAVENOUS
  Filled 2015-10-16: qty 1

## 2015-10-16 NOTE — Progress Notes (Signed)
Patient ID: Jeremy Singh, male   DOB: May 24, 1971, 45 y.o.   MRN: [id number]     [facility]      [address] [place], [place] [address]    Phone: [phone] FAX: [phone]     Subjective: Having multiple BMs. Tolerating clears. Hungry. Vss.  Afebrile.   Objective:  Vital signs:  Filed Vitals:   10/15/15 1356 10/15/15 2221 10/16/15 0205 10/16/15 0459  BP: 131/75 118/72 119/71 122/68  Pulse: 96 92 87 77  Temp: 98.9 F (37.2 C) 98.6 F (37 C) 99.2 F (37.3 C) 98.8 F (37.1 C)  TempSrc: Oral Oral Oral Oral  Resp: '18 19 17 18  '$ Height:      Weight:      SpO2: 99% 97% 99% 99%    Last BM Date: 10/14/15  Intake/Output   Yesterday:  05/06 0701 - 05/07 0700 In: 840 [P.O.:840] Out: 1750 [Urine:1750] This shift:    I/O last 3 completed shifts: In: 75 [P.O.:840; I.V.:10] Out: 2000 [Urine:2000]     Physical Exam: General: Pt awake/alert/oriented x4 in no acute distress Chest: cta. No chest wall pain w good excursion CV: Pulses intact. Regular rhythm Abdomen: +BS, abdomen is soft, nondistended.. steri strips in place to incisions, no erythema or drainage. Scrotal edema with jock strap in place Ext: SCDs BLE. No mjr edema. No cyanosis Skin: No petechiae / purpura   Problem List:   Active Problems:   S/P right inguinal hernia repair 10/12/15    Results:   Labs: Results for orders placed or performed during the hospital encounter of 10/08/15 (from the past 48 hour(s))  Glucose, capillary     Status: Abnormal   Collection Time: 10/14/15 11:34 AM  Result Value Ref Range   Glucose-Capillary 101 (H) 65 - 99 mg/dL  Glucose, capillary     Status: Abnormal   Collection Time: 10/14/15  3:54 PM  Result Value Ref Range   Glucose-Capillary 119 (H) 65 - 99 mg/dL  Glucose, capillary     Status: Abnormal   Collection Time: 10/14/15  8:09 PM  Result Value Ref Range   Glucose-Capillary 115 (H) 65 - 99 mg/dL   Glucose, capillary     Status: Abnormal   Collection Time: 10/15/15 12:10 AM  Result Value Ref Range   Glucose-Capillary 119 (H) 65 - 99 mg/dL  Glucose, capillary     Status: Abnormal   Collection Time: 10/15/15  4:03 AM  Result Value Ref Range   Glucose-Capillary 118 (H) 65 - 99 mg/dL  Creatinine, serum     Status: None   Collection Time: 10/15/15  5:57 AM  Result Value Ref Range   Creatinine, Ser 0.87 0.61 - 1.24 mg/dL   GFR calc non Af Amer >60 >60 mL/min   GFR calc Af Amer >60 >60 mL/min    Comment: (NOTE) The eGFR has been calculated using the CKD EPI equation. This calculation has not been validated in all clinical situations. eGFR's persistently <60 mL/min signify possible Chronic Kidney Disease.   Glucose, capillary     Status: Abnormal   Collection Time: 10/15/15  7:38 AM  Result Value Ref Range   Glucose-Capillary 126 (H) 65 - 99 mg/dL  Glucose, capillary     Status: None   Collection Time: 10/15/15 11:42 AM  Result Value Ref Range   Glucose-Capillary 84 65 - 99 mg/dL  Glucose, capillary     Status: Abnormal   Collection Time: 10/15/15  4:07  PM  Result Value Ref Range   Glucose-Capillary 127 (H) 65 - 99 mg/dL  Glucose, capillary     Status: Abnormal   Collection Time: 10/15/15  8:11 PM  Result Value Ref Range   Glucose-Capillary 116 (H) 65 - 99 mg/dL  Glucose, capillary     Status: Abnormal   Collection Time: 10/16/15 12:08 AM  Result Value Ref Range   Glucose-Capillary 118 (H) 65 - 99 mg/dL  Glucose, capillary     Status: Abnormal   Collection Time: 10/16/15  4:05 AM  Result Value Ref Range   Glucose-Capillary 109 (H) 65 - 99 mg/dL  Glucose, capillary     Status: Abnormal   Collection Time: 10/16/15  7:43 AM  Result Value Ref Range   Glucose-Capillary 124 (H) 65 - 99 mg/dL   Comment 1 Notify RN     Imaging / Studies: No results found.  Medications / Allergies:  Scheduled Meds: . enoxaparin (LOVENOX) injection  40 mg Subcutaneous Q24H  . insulin  aspart  0-9 Units Subcutaneous Q4H   Continuous Infusions:  PRN Meds:.acetaminophen **OR** acetaminophen, diphenhydrAMINE **OR** diphenhydrAMINE, morphine injection, ondansetron **OR** ondansetron (ZOFRAN) IV, phenol, sodium chloride flush  Antibiotics: Anti-infectives    Start     Dose/Rate Route Frequency Ordered Stop   10/12/15 1130  ceFAZolin (ANCEF) IVPB 2g/100 mL premix     2 g 200 mL/hr over 30 Minutes Intravenous  Once 10/12/15 1125 10/12/15 1155   10/12/15 1125  ceFAZolin (ANCEF) 2-4 GM/100ML-% IVPB    Comments:  Zorion, Nims   : cabinet override      10/12/15 1125 10/12/15 1550       Assessment/Plan S/p RIH repair 10/03/15 Dr. Brantley Stage   Recurrent RIH POD#4 diagnostic laparoscopy, right inguinal hernia repair with mesh--Dr. Georgette Dover -fulls, advance diet as tolerated, add po pain meds and wean off IV -IS, mobilize, pain control. Sling for scrotal swelling VTE prophylaxis-SCD/lovenox PCM-DC TPN after this bag runs Dispo-anticipate 24-48h     Erby Pian, ANP-BC [facility]  10/16/2015 8:49 AM

## 2015-10-16 NOTE — Progress Notes (Signed)
PARENTERAL NUTRITION CONSULT NOTE - FOLLOW UP  Pharmacy Consult for TPN Indication: Prolonged ileus  No Known Allergies  Patient Measurements: Height: 5\' 6"  (167.6 cm) Weight: 182 lb 1.6 oz (82.6 kg) IBW/kg (Calculated) : 63.8 Adjusted Body Weight:  Usual Weight:   Vital Signs: Temp: 98.8 F (37.1 C) (05/07 0459) Temp Source: Oral (05/07 0459) BP: 122/68 mmHg (05/07 0459) Pulse Rate: 77 (05/07 0459) Intake/Output from previous day: 05/06 0701 - 05/07 0700 In: 840 [P.O.:840] Out: 1750 [Urine:1750] Intake/Output from this shift: Total I/O In: 360 [P.O.:360] Out: -   Labs: No results for input(s): WBC, HGB, HCT, PLT, APTT, INR in the last 72 hours.   Recent Labs  10/14/15 0513 10/15/15 0557  NA 140  --   K 4.0  --   CL 102  --   CO2 27  --   GLUCOSE 119*  --   BUN 14  --   CREATININE 1.14 0.87  CALCIUM 8.4*  --   MG 2.0  --   PHOS 2.7  --    Estimated Creatinine Clearance: 107 mL/min (by C-G formula based on Cr of 0.87).    Recent Labs  10/16/15 0008 10/16/15 0405 10/16/15 0743  GLUCAP 118* 109* 124*    Insulin Requirements in the past 24 hours:  2 units sens SSI  Current Nutrition:  Full liquid diet  Assessment: Pt has been on TPN for ileus, now resolving.  (+)BMs.  He has been tolerating clears and is hungry, with diet advanced.   TPN to stop when this bag complete.   Plan:  D/C all TPN labs D/C SSI  Marisue HumbleKendra Kortnie Stovall, PharmD Clinical Pharmacist [facility]

## 2015-10-17 MED ORDER — OXYCODONE-ACETAMINOPHEN 5-325 MG PO TABS: 1.0000 | ORAL_TABLET | Freq: Four times a day (QID) | ORAL | Status: DC | PRN

## 2015-10-17 NOTE — Discharge Instructions (Signed)
[facility] _______[facility]  UMBILICAL OR INGUINAL HERNIA REPAIR: POST OP INSTRUCTIONS  Always review your discharge instruction sheet given to you by the facility where your surgery was performed. IF YOU HAVE DISABILITY OR FAMILY LEAVE FORMS, YOU MUST BRING THEM TO THE OFFICE FOR PROCESSING.   DO NOT GIVE THEM TO YOUR DOCTOR.  1. A  prescription for pain medication may be given to you upon discharge.  Take your pain medication as prescribed, if needed.  If narcotic pain medicine is not needed, then you may take acetaminophen (Tylenol) or ibuprofen (Advil) as needed. 2. Take your usually prescribed medications unless otherwise directed. 3. If you need a refill on your pain medication, please contact your pharmacy.  They will contact our office to request authorization. Prescriptions will not be filled after 5 pm or on week-ends. 4. You should follow a light diet the first 24 hours after arrival home, such as soup and crackers, etc.  Be sure to include lots of fluids daily.  Resume your normal diet the day after surgery. 5. Most patients will experience some swelling and bruising around the umbilicus or in the groin and scrotum.  Ice packs and reclining will help.  Swelling and bruising can take several days to resolve.  6. It is common to experience some constipation if taking pain medication after surgery.  Increasing fluid intake and taking a stool softener (such as Colace) will usually help or prevent this problem from occurring.  A mild laxative (Milk of Magnesia or Miralax) should be taken according to package directions if there are no bowel movements after 48 hours. 7. Unless discharge instructions indicate otherwise, you may remove your bandages 24-48 hours after surgery, and you may shower at that time.  You may have steri-strips (small skin tapes) in place directly over the incision.  These strips should be left on the skin for 7-10 days.  If your surgeon used skin glue on the  incision, you may shower in 24 hours.  The glue will flake off over the next 2-3 weeks.  Any sutures or staples will be removed at the office during your follow-up visit. 8. ACTIVITIES:  You may resume regular (light) daily activities beginning the next day--such as daily self-care, walking, climbing stairs--gradually increasing activities as tolerated.  You may have sexual intercourse when it is comfortable.  Refrain from any heavy lifting or straining until approved by your doctor. a. You may drive when you are no longer taking prescription pain medication, you can comfortably wear a seatbelt, and you can safely maneuver your car and apply brakes. b. RETURN TO WORK:  __________________________________________________________ 9. You should see your doctor in the office for a follow-up appointment approximately 2-3 weeks after your surgery.  Make sure that you call for this appointment within a day or two after you arrive home to insure a convenient appointment time. 10. OTHER INSTRUCTIONS:  __________________________________________________________________________________________________________________________________________________________________________________________  WHEN TO CALL YOUR DOCTOR: 1. Fever over 101.0 2. Inability to urinate 3. Nausea and/or vomiting 4. Extreme swelling or bruising 5. Continued bleeding from incision. 6. Increased pain, redness, or drainage from the incision  The clinic staff is available to answer your questions during regular business hours.  Please don't hesitate to call and ask to speak to one of the nurses for clinical concerns.  If you have a medical emergency, go to the nearest emergency room or call 911.  A surgeon from [facility] is always on call at the hospital     [address], [address], [place], [place]  [address] ?  [address] [address], [place], [place]   [address] [phone] ? [phone] ? FAX [phone] Web site:  [email or web address]  

## 2015-10-17 NOTE — Discharge Summary (Signed)
Physician Discharge Summary  Jeremy Singh ZOX:[id number] DOB: [birth year] DOA: 10/08/2015  PCP: No PCP Per Patient  Consultation:  none  Admit date: 10/08/2015 Discharge date: 10/17/2015  Recommendations for Outpatient Follow-up:   Follow-up Information    Schedule an appointment as soon as possible for a visit with [facility].   Contact information:   [address] [place] [address] [phone]      Follow up with Wynona Luna., MD In 2 weeks.   Specialty:  General Surgery   Why:  post op check   Contact information:   [address] [place] [address] [phone]      Discharge Diagnoses:  1. Small bowel obstruction 2. Recurrent right inguinal hernia    Surgical Procedure:  Repair of right inguinal hernia with mesh--Dr. Larwance Rote 10/03/15 diagnostic laparoscopy, right inguinal hernia repair with mesh--Dr. Corliss Skains 10/12/15   Discharge Condition: stable Disposition: home  Diet recommendation: regular  Filed Weights   10/08/15 1430  Weight: 82.6 kg (182 lb 1.6 oz)    Filed Vitals:   10/16/15 2203 10/17/15 0456  BP: 108/56 120/77  Pulse: 90 82  Temp: 98.8 F (37.1 C) 98.9 F (37.2 C)  Resp: 17 18      Hospital Course:  Jeremy Singh underwent a right inguinal hernia repair on 10/03/15 and was discharged on 10/04/15.  He was readmitted  With nausea, vomiting and abdominal distention.  He was admitted for NGT decompression and managed for SBO versus an ileus.  He failed to improve and underwent a diagnostic laparoscopy and inguinal hernia repair.  He was started on TPN pre operatively.  Diet was advanced as ileus resolved.  On POD#5 he was felt stable for discharge home.  Medication risks, benefits and therapeutic alternatives were reviewed with the patient.  He verbalizes understanding.   Physical Exam: General: Pt awake/alert/oriented x4 in no acute distress Chest: cta. No chest wall pain w  good excursion CV: Pulses intact. Regular rhythm Abdomen: +BS, abdomen is soft, nondistended.. steri strips in place to incisions, no erythema or drainage. Scrotal edema with jock strap in place Ext: SCDs BLE. No mjr edema. No cyanosis Skin: No petechiae / purpura Discharge Instructions     Medication List    STOP taking these medications        Oxycodone HCl 10 MG Tabs      TAKE these medications        acetaminophen 500 MG tablet  Commonly known as:  TYLENOL  Take 2 tablets (1,000 mg total) by mouth every 6 (six) hours.     ibuprofen 200 MG tablet  Commonly known as:  MOTRIN IB  You can take 2-3 tablets every 6 hours as needed for pain.  You can buy this at any pharmacy.     oxyCODONE-acetaminophen 5-325 MG tablet  Commonly known as:  PERCOCET/ROXICET  Take 1-2 tablets by mouth every 6 (six) hours as needed for moderate pain or severe pain.           Follow-up Information    Schedule an appointment as soon as possible for a visit with [facility].   Contact information:   [address] [place] [address] [phone]      Follow up with Wynona Luna., MD In 2 weeks.   Specialty:  General Surgery   Why:  post op check   Contact information:   [address]  [address] [place] [address] [phone]        The results of significant diagnostics from this hospitalization (including imaging, microbiology, ancillary and laboratory) are listed below for reference.    Significant Diagnostic Studies: Dg Abd 1 View  10/11/2015  CLINICAL DATA:  Followup of small bowel obstruction. EXAM: ABDOMEN - 1 VIEW COMPARISON:  Plain film 1 day prior.  CT 10/03/2015 FINDINGS: Single supine view abdomen and pelvis. Proximal and mid small bowel dilatation again identified. Example loop at 3.9 cm today versus 4.3 cm yesterday. No pneumatosis or free intraperitoneal air. Relative paucity of colonic gas. There is stool  within the ascending colon. Probable phleboliths in the left hemipelvis. IMPRESSION: Slight improvement in small bowel obstruction pattern. Electronically Signed   By: Jeronimo Greaves M.D.   On: 10/11/2015 07:56   Dg Abd 1 View  10/10/2015  CLINICAL DATA:  45 year old male with abdominal tenderness and small bowel obstruction following recent hernia repair EXAM: ABDOMEN - 1 VIEW COMPARISON:  Prior abdominal radiographs 10/09/2015 FINDINGS: Persistent marked gaseous distension of small bowel which is insignificantly changed compared to prior. The maximal diameter of the dilated small bowel again measures 5.3 cm. No massive free air on this single portable supine view. The bony structures are unremarkable. Phleboliths project over the left anatomic pelvis. IMPRESSION: Persistent and unchanged appearance of probable small bowel obstruction. Maximal dilatation of the dilated small bowel loops stable at 5.3 cm in the right upper quadrant. Electronically Signed   By: Malachy Moan M.D.   On: 10/10/2015 07:56   Ct Abdomen Pelvis W Contrast  10/03/2015  CLINICAL DATA:  Incarcerated hernia EXAM: CT ABDOMEN AND PELVIS WITH CONTRAST TECHNIQUE: Multidetector CT imaging of the abdomen and pelvis was performed using the standard protocol following bolus administration of intravenous contrast. CONTRAST:  1 ISOVUE-300 IOPAMIDOL (ISOVUE-300) INJECTION 61% COMPARISON:  12/10/2008 FINDINGS: Lower chest: The lung bases are unremarkable. Breathing motion artifacts are noted. Hepatobiliary: Enhanced liver is unremarkable. No calcified gallstones are noted within gallbladder. Pancreas: No mass, inflammatory changes, or other significant abnormality. Spleen: Within normal limits in size and appearance. Adrenals/Urinary Tract: No adrenal gland mass. Enhanced kidneys are symmetrical in size. No hydronephrosis or hydroureter. Delayed renal images shows bilateral renal symmetrical excretion. Bilateral visualized proximal ureter is  unremarkable. Mild distorted right anterior urinary bladder by a mass effect from right inguinal hernia. Stomach/Bowel: No gastric outlet obstruction. Mild distended small bowel loops with fluid in right lower quadrant. There is mesenteric edema and tiny amount of mesenteric fluid in right lower pelvis and right inguinal region. There is a large right inguinal scrotal canal hernia containing fluid distended small bowel loops and congested vessels. Tiny central mesenteric edema. There is transition point in caliber of small bowel in a loop entering the hernia best seen in axial image 70. Findings are consistent with small bowel obstruction due to incarcerated right inguinal scrotal canal hernia. The hernia and is best visualized in coronal image 42 measures 16 cm cranial caudally by 10 cm transverse diameter. Small amount of fluid noted in dependent portion of the hernia. There is thickening of dependent scrotal wall. No pericecal inflammation. The appendix is not clearly identified. Vascular/Lymphatic: There is no aortic aneurysm. No retroperitoneal or mesenteric adenopathy. Reproductive: Prostate gland and seminal vesicles are unremarkable. There is mass effect on the penile base with left lateral deviation from door large right inguinal scrotal hernia. Other: There is a small left inguinal canal hernia containing fluid measures 2.3 cm. No free  abdominal air. No pelvic free air. Musculoskeletal: Sagittal images of the spine shows mild degenerative changes lower thoracic spine. There is Schmorl's node deformity lower endplate of L1 vertebral body. IMPRESSION: 1. Findings consistent with small bowel obstruction due to incarcerated right inguinal scrotal hernia containing omental fat and multiple fluid distended small bowel loops. There is small amount of fluid within dependent aspect of the hernia. Hernia measures at least 16 x 10 cm. There is also mesenteric edema and engorged mesenteric vessels in right lower  anterior pelvis/inguinal region. Mesenteric edema and congested vessels are noted centrally within hernia. 2. No pericecal inflammation. The appendix is not clearly identified. 3. No free abdominal air. 4. No hydronephrosis or hydroureter. These results were called by telephone at the time of interpretation on 10/03/2015 at 9:01 am to Dr. Arby Barrette , who verbally acknowledged these results. Electronically Signed   By: Natasha Mead M.D.   On: 10/03/2015 09:03   Dg Abd Acute W/chest  10/08/2015  CLINICAL DATA:  Distended painful stomach. History of right inguinal hernia repair 5 days ago. EXAM: DG ABDOMEN ACUTE W/ 1V CHEST COMPARISON:  CT abdomen dated 10/03/2015. FINDINGS: Single view of the chest: Heart size is normal.  Lungs are clear. Supine and upright views of the abdomen: Distended small bowel loops within the upper abdomen and left abdomen, with associated air-fluid levels suggesting a mechanical small bowel obstruction. No evidence of free intraperitoneal air. IMPRESSION: 1. Findings suggest persistent mechanical small bowel obstruction. 2. Lungs are clear and there is no evidence of acute cardiopulmonary abnormality Electronically Signed   By: Bary Richard M.D.   On: 10/08/2015 11:23   Dg Abd Portable 1v-small Bowel Obstruction Protocol-initial, 8 Hr Delay  10/10/2015  CLINICAL DATA:  Small bowel obstruction EXAM: PORTABLE ABDOMEN - 1 VIEW COMPARISON:  Earlier same day FINDINGS: Enteric tube tip and side port again projected the expected location of the gastric fundus. Persistent gaseous distention of multiple loops of small bowel with index loop within the left mid and abdomen measuring approximately 4.3 cm in diameter. Nondiagnostic evaluation for pneumoperitoneum secondary to supine positioning and exclusion of the lower thorax. No pneumatosis or portal venous gas. No acute osseus abnormalities. IMPRESSION: 1. Enteric tube tip and side port again project over the gastric fundus. 2. Similar  findings worrisome for small bowel obstruction. Electronically Signed   By: Simonne Come M.D.   On: 10/10/2015 22:06   Dg Abd Portable 1v-small Bowel Protocol-position Verification  10/10/2015  CLINICAL DATA:  Check NG tube position. EXAM: PORTABLE ABDOMEN - 1 VIEW COMPARISON:  10/10/2015 FINDINGS: NG tube coils in the mid stomach with the tip in the fundus. Continued small bowel prominence compatible with small bowel obstruction. No free air organomegaly. IMPRESSION: NG tube coils in the stomach with the tip in the fundus. Continued small bowel obstruction pattern. Electronically Signed   By: Charlett Nose M.D.   On: 10/10/2015 09:40   Dg Abd Portable 2v  10/09/2015  CLINICAL DATA:  45 year old male with history of right inguinal hernia surgery 6 days ago. Distended abdomen. EXAM: PORTABLE ABDOMEN - 2 VIEW COMPARISON:  Abdominal radiograph 10/08/2015. FINDINGS: Numerous gas-filled loops of small bowel measuring up to 5.3 cm in the upper abdomen. Numerous air-fluid levels on the lateral decubitus view. No pneumoperitoneum. There is a paucity of colonic and rectal gas noted. IMPRESSION: 1. Findings remain concerning for probable partial small bowel obstruction. 2. No pneumoperitoneum. Electronically Signed   By: Brayton Mars.D.  On: 10/09/2015 10:29    Microbiology: No results found for this or any previous visit (from the past 240 hour(s)).   Labs: Basic Metabolic Panel:  Recent Labs Lab 10/12/15 0754 10/13/15 0538 10/14/15 0513 10/15/15 0557  NA 141 142 140  --   K 4.4 4.9 4.0  --   CL 108 106 102  --   CO2 26 26 27   --   GLUCOSE 96 106* 119*  --   BUN 18 15 14   --   CREATININE 1.08 1.05 1.14 0.87  CALCIUM 8.9 8.5* 8.4*  --   MG  --  2.1 2.0  --   PHOS  --  3.2 2.7  --    Liver Function Tests:  Recent Labs Lab 10/13/15 0538  AST 21  ALT 17  ALKPHOS 38  BILITOT 1.5*  PROT 6.2*  ALBUMIN 2.5*   No results for input(s): LIPASE, AMYLASE in the last 168 hours. No results  for input(s): AMMONIA in the last 168 hours. CBC:  Recent Labs Lab 10/12/15 0754 10/13/15 0538  WBC 6.8 9.3  NEUTROABS  --  6.6  HGB 11.1* 10.5*  HCT 34.0* 33.3*  MCV 93.7 94.3  PLT 361 361   Cardiac Enzymes: No results for input(s): CKTOTAL, CKMB, CKMBINDEX, TROPONINI in the last 168 hours. BNP: BNP (last 3 results) No results for input(s): BNP in the last 8760 hours.  ProBNP (last 3 results) No results for input(s): PROBNP in the last 8760 hours.  CBG:  Recent Labs Lab 10/16/15 0008 10/16/15 0405 10/16/15 0743 10/16/15 1229 10/16/15 1615  GLUCAP 118* 109* 124* 107* 99    Active Problems:   S/P right inguinal hernia repair 10/12/15   Signed:  Anette RiedelEmina Eithan Beagle, ANP-BC

## 2015-11-18 ENCOUNTER — Other Ambulatory Visit: Payer: Self-pay | Admitting: Surgery

## 2015-11-18 DIAGNOSIS — Z9889 Other specified postprocedural states: Principal | ICD-10-CM

## 2015-11-18 DIAGNOSIS — Z8719 Personal history of other diseases of the digestive system: Secondary | ICD-10-CM

## 2015-11-21 ENCOUNTER — Other Ambulatory Visit: Payer: Self-pay | Admitting: Surgery

## 2015-11-21 DIAGNOSIS — Z9889 Other specified postprocedural states: Principal | ICD-10-CM

## 2015-11-21 DIAGNOSIS — Z8719 Personal history of other diseases of the digestive system: Secondary | ICD-10-CM

## 2015-11-22 ENCOUNTER — Ambulatory Visit
Admission: RE | Admit: 2015-11-22 | Discharge: 2015-11-22 | Disposition: A | Discharge status: Home / Self Care | Payer: No Typology Code available for payment source | Source: Ambulatory Visit | Attending: Surgery | Admitting: Surgery

## 2015-11-22 ENCOUNTER — Other Ambulatory Visit: Payer: Self-pay

## 2015-11-22 DIAGNOSIS — Z9889 Other specified postprocedural states: Principal | ICD-10-CM

## 2015-11-22 DIAGNOSIS — Z8719 Personal history of other diseases of the digestive system: Secondary | ICD-10-CM

## 2015-11-22 NOTE — Progress Notes (Signed)
Quick Note:  Please refer this patient to Urology as soon as possible. Reason for referral - "large hydrocele/ hematoma with questionable viable testicle after recent repair of massive incarcerated inguinal hernia"  Thanks ______

## 2015-11-25 ENCOUNTER — Other Ambulatory Visit: Payer: Self-pay

## 2015-11-28 ENCOUNTER — Other Ambulatory Visit: Payer: Self-pay

## 2017-08-05 IMAGING — CR DG ABD PORTABLE 2V
2 series · 2 of 2 positions shown · non-contrast
Comparison: Abdominal radiograph 10/08/2015.

CLINICAL DATA: 44-year-old male with history of right inguinal
hernia surgery 6 days ago. Distended abdomen.

EXAM:
PORTABLE ABDOMEN - 2 VIEW

[AP]
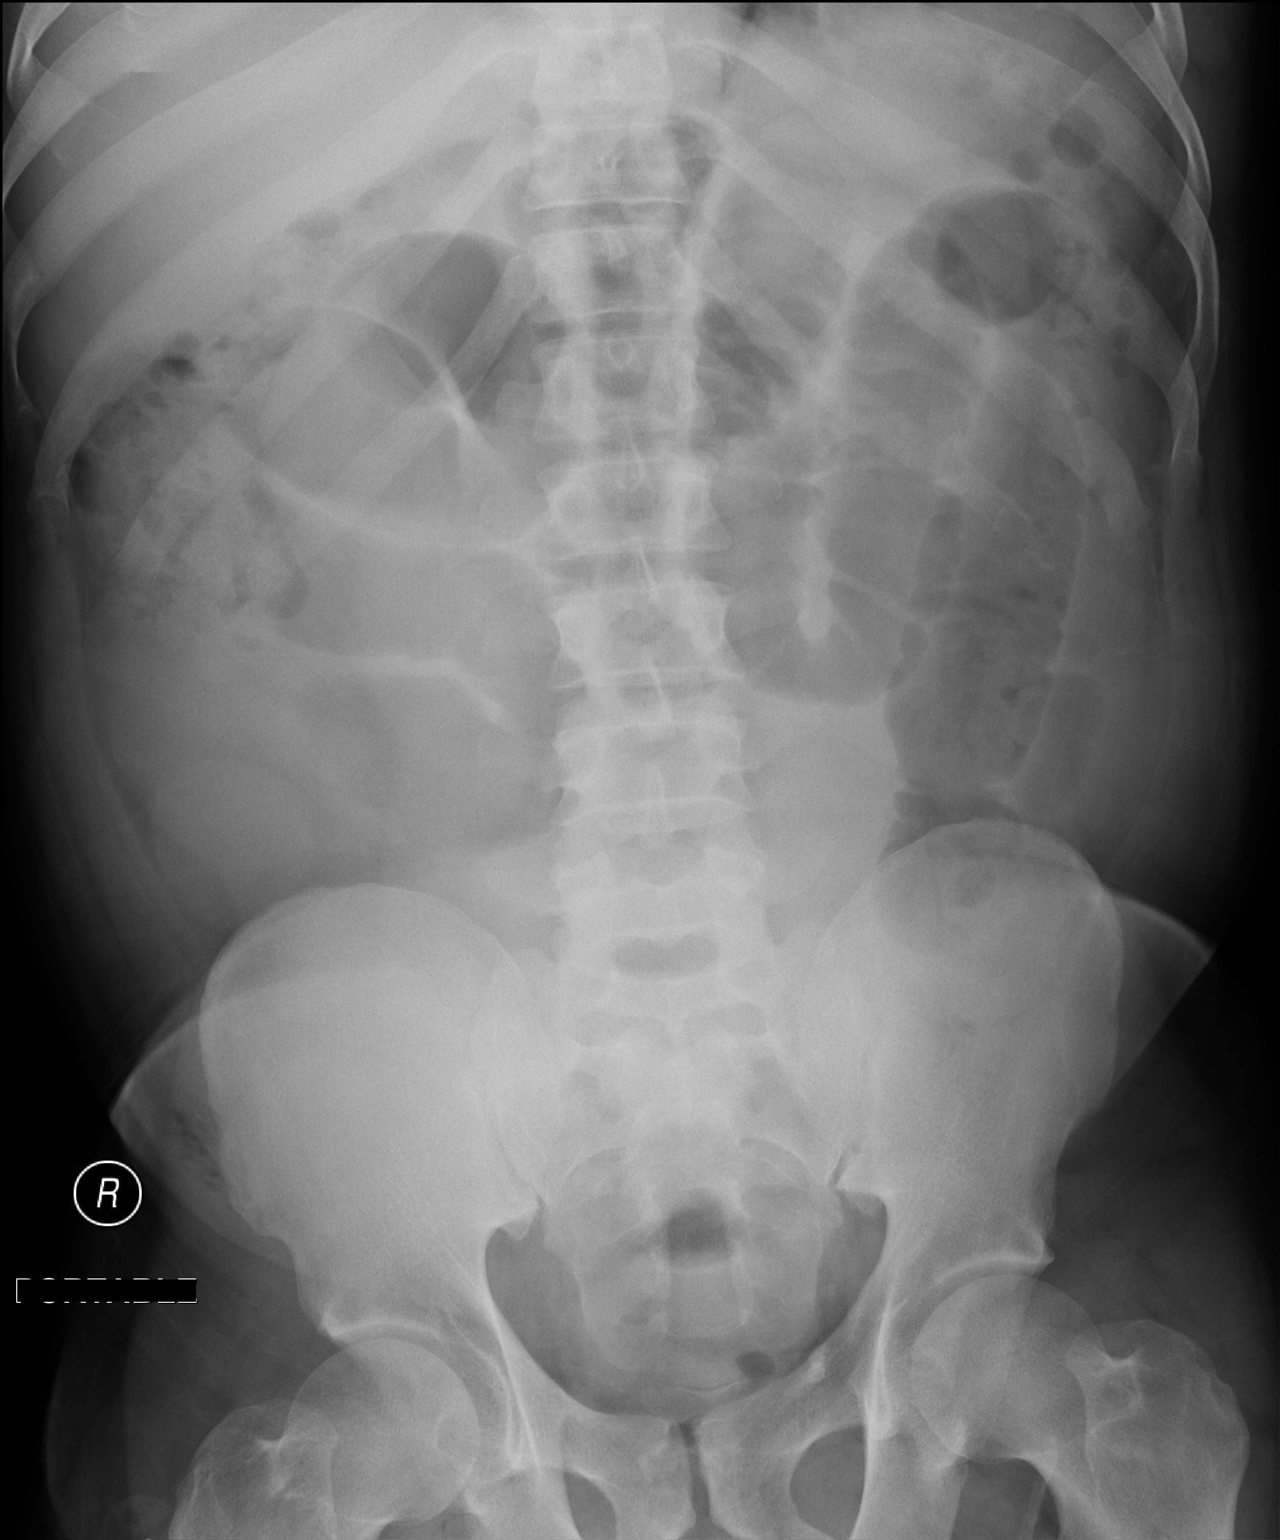

[ap lld]
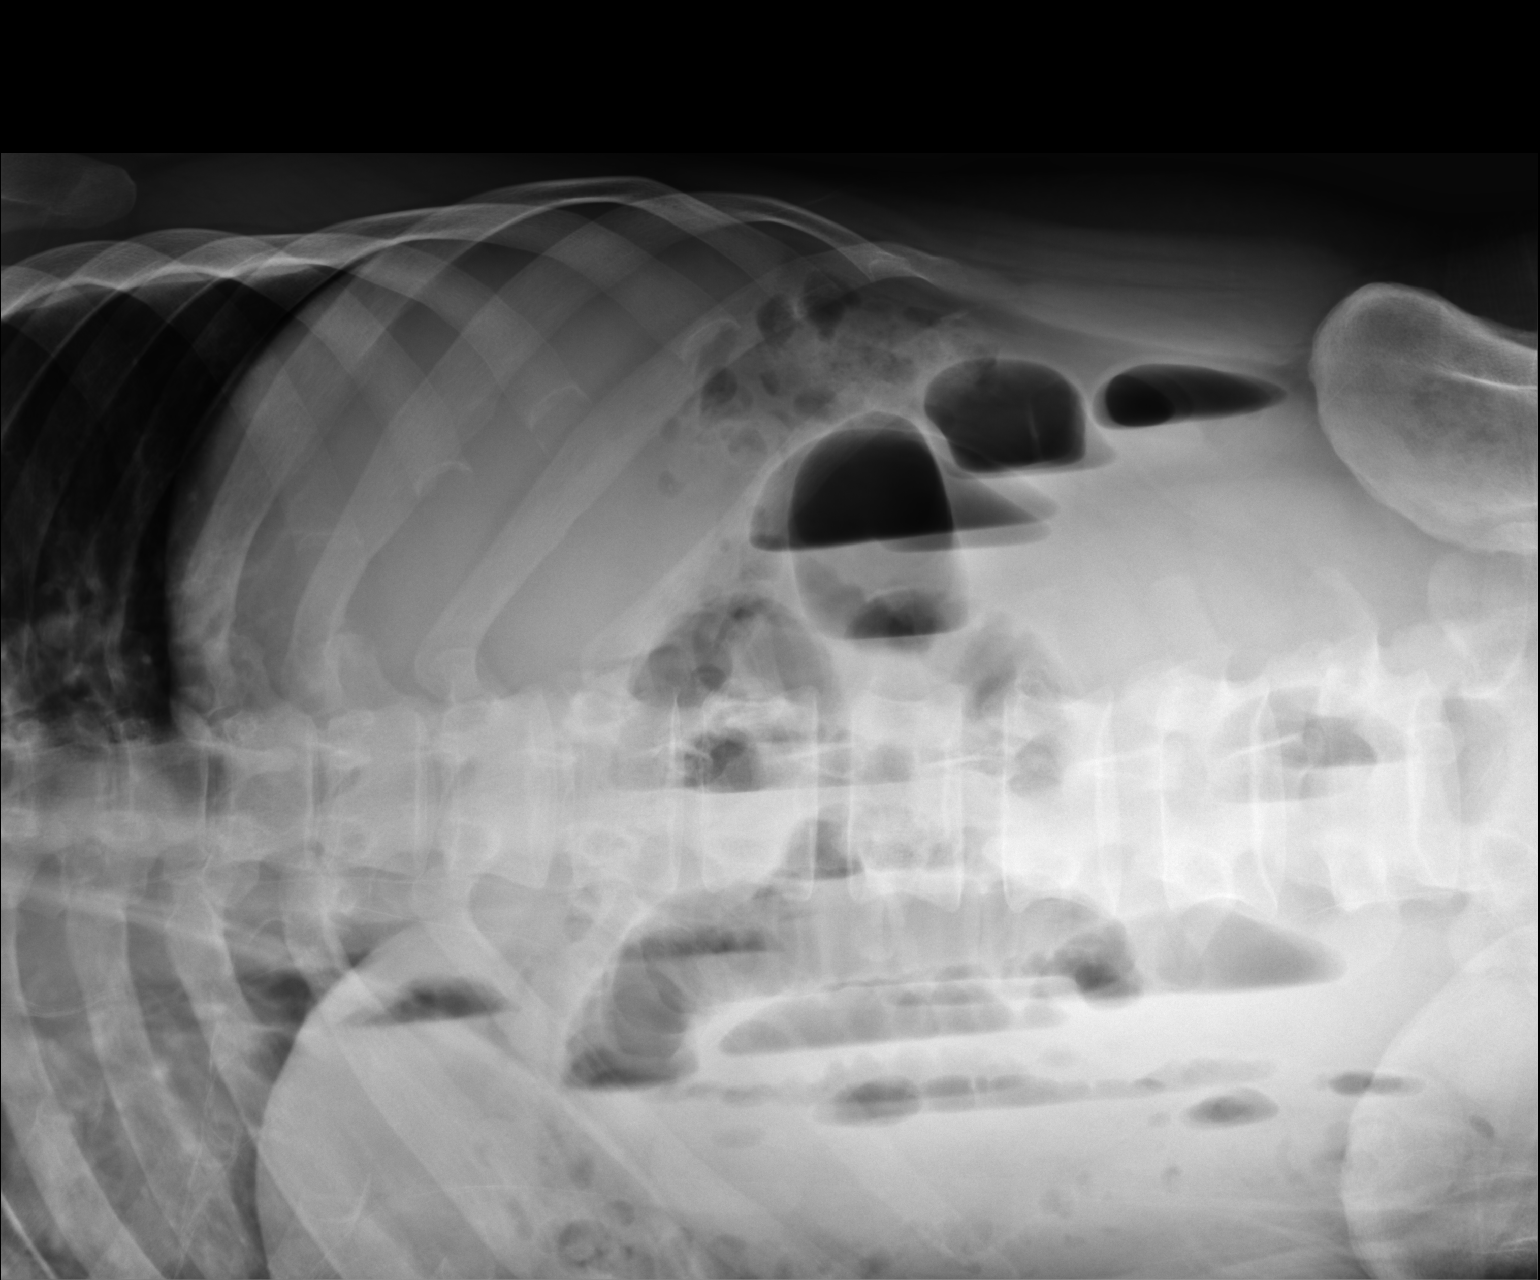

[2 of 2 positions shown; findings below may reference images not displayed]

FINDINGS: Numerous gas-filled loops of small bowel measuring up to 5.3 cm in
the upper abdomen. Numerous air-fluid levels on the lateral
decubitus view. No pneumoperitoneum. There is a paucity of colonic
and rectal gas noted.
IMPRESSION: 1. Findings remain concerning for probable partial small bowel
obstruction.
2. No pneumoperitoneum.

## 2017-08-06 IMAGING — CR DG ABD PORTABLE 1V
1 series · 1 of 1 positions shown · non-contrast
Comparison: 10/10/2015

CLINICAL DATA: Check NG tube position.

EXAM:
PORTABLE ABDOMEN - 1 VIEW

[AP]
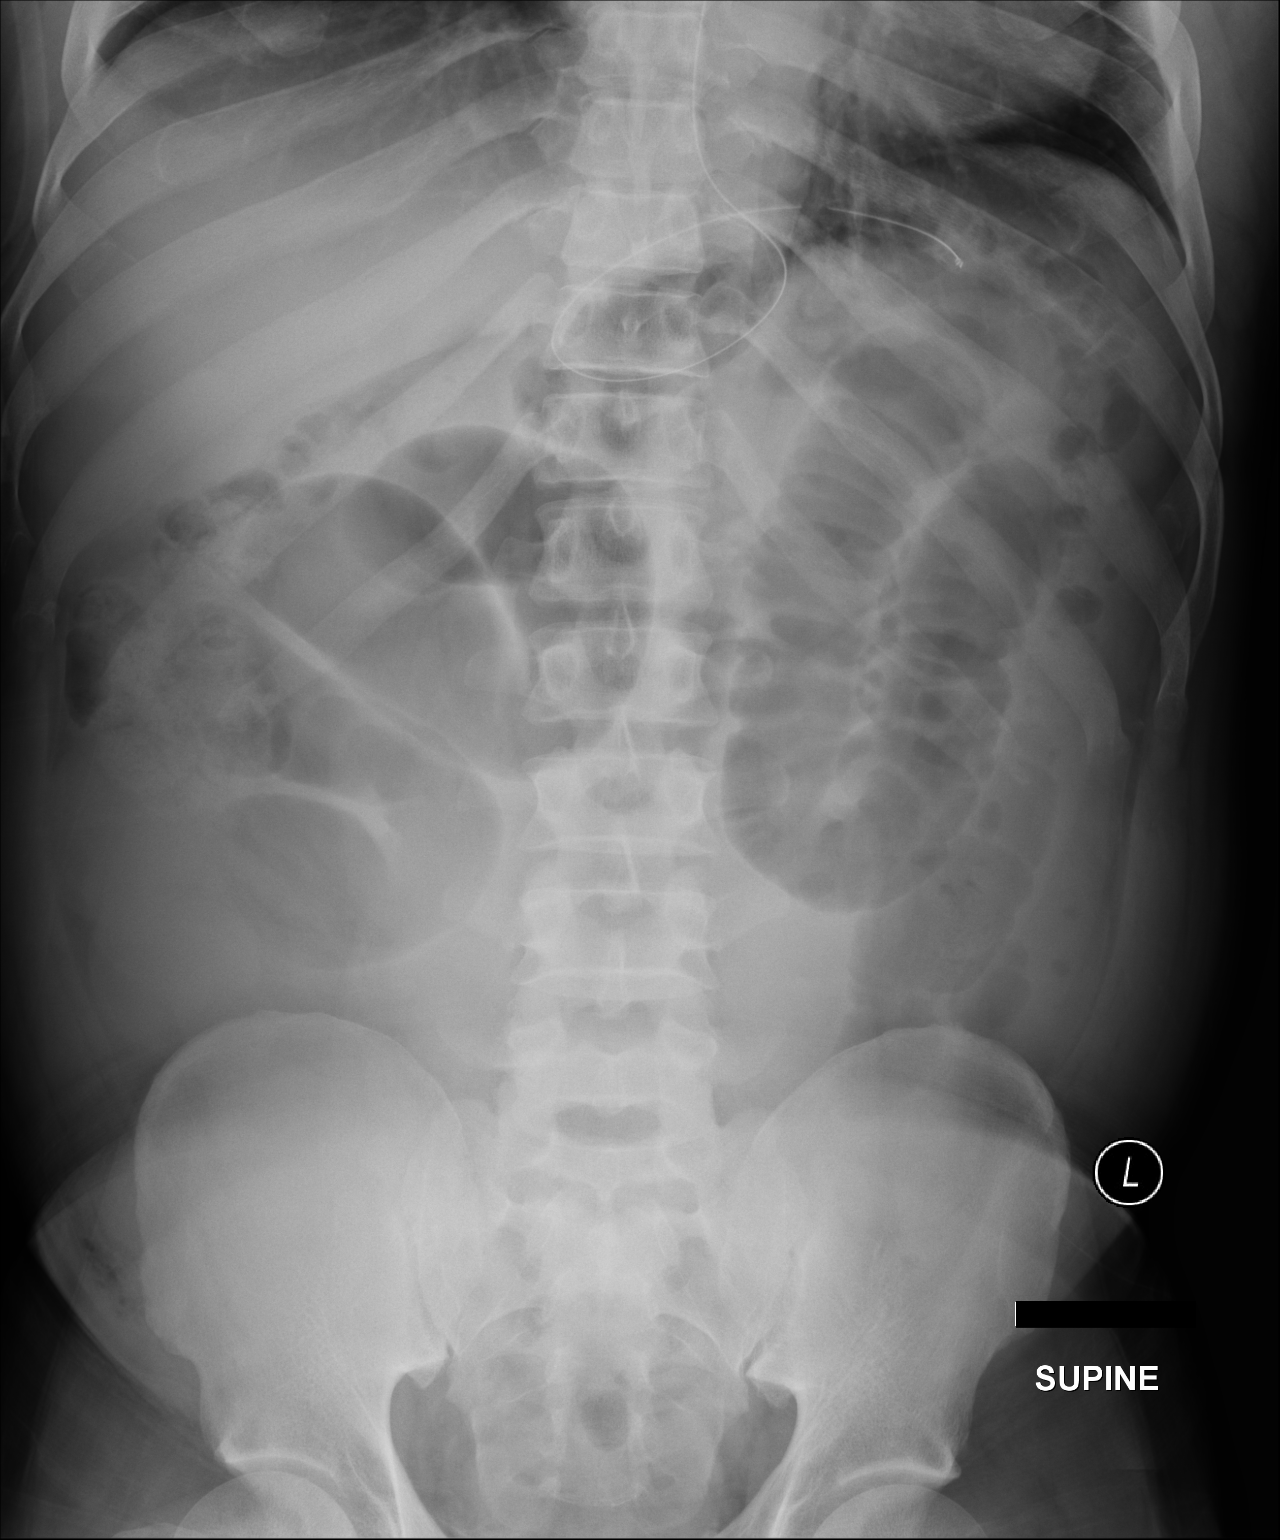

[1 of 1 positions shown; findings below may reference images not displayed]

FINDINGS: NG tube coils in the mid stomach with the tip in the fundus.
Continued small bowel prominence compatible with small bowel
obstruction. No free air organomegaly.
IMPRESSION: NG tube coils in the stomach with the tip in the fundus. Continued
small bowel obstruction pattern.

## 2018-05-23 ENCOUNTER — Ambulatory Visit (INDEPENDENT_AMBULATORY_CARE_PROVIDER_SITE_OTHER): Payer: PRIVATE HEALTH INSURANCE

## 2018-05-23 ENCOUNTER — Ambulatory Visit (HOSPITAL_COMMUNITY)
Admission: EM | Admit: 2018-05-23 | Discharge: 2018-05-23 | Disposition: A | Discharge status: Home / Self Care | Payer: PRIVATE HEALTH INSURANCE | Attending: Emergency Medicine | Admitting: Emergency Medicine

## 2018-05-23 ENCOUNTER — Encounter (HOSPITAL_COMMUNITY): Payer: Self-pay | Admitting: *Deleted

## 2018-05-23 ENCOUNTER — Other Ambulatory Visit: Payer: Self-pay

## 2018-05-23 DIAGNOSIS — M7711 Lateral epicondylitis, right elbow: Secondary | ICD-10-CM

## 2018-05-23 NOTE — ED Triage Notes (Signed)
States he hurt his right arm several weeks ago ,states it is hurting worse

## 2018-05-23 NOTE — Discharge Instructions (Addendum)
°  Is an example of a counter force brace

## 2018-05-23 NOTE — ED Provider Notes (Signed)
[facility]    CSN: [id number] Arrival date & time: 05/23/18  1514     History   Chief Complaint Chief Complaint  Patient presents with  . Arm Pain    HPI Jeremy Singh is a 47 y.o. male.   Primary care doctor is referring him to an orthopedist.  The history is provided by the patient.  Arm Pain  This is a chronic problem. The current episode started more than 1 week ago (He had an injury at work 1 year ago.  He was about to fall, and he forcibly extended his right arm to catch himself.  He struck his hand on a car.  He was evaluated at an urgent care, and he had negative x-rays.  The arm has continued to bother him since t). The problem occurs constantly. The problem has been gradually worsening. Pertinent negatives include no chest pain, no abdominal pain, no headaches and no shortness of breath. 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  Certain movements affect him, and he has had 2 modify his activities.). The symptoms are relieved by rest. He has tried rest for the symptoms. The treatment provided mild relief.    Past Medical History:  Diagnosis Date  . Abscess of back    multiple other sites    Patient Active Problem List   Diagnosis Date Noted  . S/P right inguinal hernia repair 10/12/15 10/08/2015  . Incarcerated right inguinal hernia 10/03/2015  . Right inguinal hernia 10/03/2015    Past Surgical History:  Procedure Laterality Date  . INGUINAL HERNIA REPAIR Right 10/03/2015   Procedure: RIGHT HERNIA REPAIR INGUINAL INCARCERATED ;  Surgeon: Harriette Bouillon, MD;  Location: [facility] OR;  Service: General;  Laterality: Right;  . INGUINAL HERNIA REPAIR Right 10/12/2015   Procedure: RIGHT INGUINAL HERNIA REPAIR;  Surgeon: Manus Rudd, MD;  Location: [facility] OR;  Service: General;  Laterality: Right;  . INSERTION OF MESH Right 10/03/2015   Procedure: INSERTION OF MESH;  Surgeon: Harriette Bouillon, MD;  Location: [facility] OR;   Service: General;  Laterality: Right;  . INSERTION OF MESH Right 10/12/2015   Procedure: INSERTION OF MESH;  Surgeon: Manus Rudd, MD;  Location: [facility] OR;  Service: General;  Laterality: Right;  . LAPAROSCOPY N/A 10/12/2015   Procedure: DIAGNOSTIC LAPAROSCOPY;  Surgeon: Manus Rudd, MD;  Location: [facility] OR;  Service: General;  Laterality: N/A;       Home Medications    Prior to Admission medications   Medication Sig Start Date End Date Taking? Authorizing Provider  acetaminophen (TYLENOL) 500 MG tablet Take 2 tablets (1,000 mg total) by mouth every 6 (six) hours. 10/04/15   Sherrie George, PA-C  ibuprofen (MOTRIN IB) 200 MG tablet You can take 2-3 tablets every 6 hours as needed for pain.  You can buy this at any pharmacy. 10/04/15   Sherrie George, PA-C  oxyCODONE-acetaminophen (PERCOCET/ROXICET) 5-325 MG tablet Take 1-2 tablets by mouth every 6 (six) hours as needed for moderate pain or severe pain. 10/17/15   Ashok Norris, NP    Family History Family History  Problem Relation Age of Onset  . Cancer Mother   . Cancer Father     Social History Social History   Tobacco Use  . Smoking status: Never Smoker  . Smokeless tobacco: Never Used  Substance Use Topics  . Alcohol use: Yes    Comment: occasionally  . Drug use: No     Allergies  Patient has no known allergies.   Review of Systems Review of Systems  Constitutional: Negative for chills and fever.  HENT: Negative for ear pain and sore throat.   Eyes: Negative for pain and visual disturbance.  Respiratory: Negative for cough and shortness of breath.   Cardiovascular: Negative for chest pain and palpitations.  Gastrointestinal: Negative for abdominal pain and vomiting.  Genitourinary: Negative for dysuria and hematuria.  Musculoskeletal: Negative for arthralgias and back pain.  Skin: Negative for color change and rash.  Neurological: Negative for seizures, syncope and headaches.  All other systems reviewed and  are negative.    Physical Exam Triage Vital Signs ED Triage Vitals  Enc Vitals Group     BP 05/23/18 1525 (!) 153/81     Pulse Rate 05/23/18 1525 80     Resp 05/23/18 1525 18     Temp 05/23/18 1525 98.5 F (36.9 C)     Temp Source 05/23/18 1525 Oral     SpO2 05/23/18 1525 96 %     Weight --      Height --      Head Circumference --      Peak Flow --      Pain Score 05/23/18 1527 1     Pain Loc --      Pain Edu? --      Excl. in GC? --    No data found.  Updated Vital Signs BP (!) 153/81 (BP Location: Left Arm)   Pulse 80   Temp 98.5 F (36.9 C) (Oral)   Resp 18   SpO2 96%   Visual Acuity Right Eye Distance:   Left Eye Distance:   Bilateral Distance:    Right Eye Near:   Left Eye Near:    Bilateral Near:     Physical Exam Constitutional:      Appearance: Normal appearance.  HENT:     Head: Normocephalic and atraumatic.     Nose: Nose normal.     Mouth/Throat:     Mouth: Mucous membranes are moist.  Eyes:     Conjunctiva/sclera: Conjunctivae normal.  Pulmonary:     Effort: Pulmonary effort is normal. No respiratory distress.  Musculoskeletal:     Comments: The right arm is normal to inspection.  There is tenderness to palpation over the common extensor tendon insertion.  Pain is increased with resisted third finger and wrist extension.  Elbow range of motion is full and painless.  No tenderness over the triceps tendon.  No weakness over the triceps tendon.  Distal pulses are normal.  Sensation is intact.  Skin:    General: Skin is warm and dry.  Neurological:     General: No focal deficit present.     Mental Status: He is alert and oriented to person, place, and time.  Psychiatric:        Mood and Affect: Mood normal.        Behavior: Behavior normal.      UC Treatments / Results  Labs (all labs ordered are listed, but only abnormal results are displayed) Labs Reviewed - No data to display  EKG None  Radiology Dg Elbow Complete  Right  Result Date: 05/23/2018 CLINICAL DATA:  Patient states that he injured his right elbow x 1 year ago, continued pain with overuse from physical occupation. Pain along lateral side. EXAM: RIGHT ELBOW - COMPLETE 3+ VIEW COMPARISON:  None. FINDINGS: There is no evidence of fracture, dislocation, or joint effusion. There is no evidence  of arthropathy or other focal bone abnormality. Soft tissues are unremarkable. IMPRESSION: Negative. Electronically Signed   By: D  Hassell M.D.   On: 12Corlis Leak/13/2019 16:20    Procedures Procedures (including critical care time)  Medications Ordered in UC Medications - No data to display  Initial Impression / Assessment and Plan / UC Course  I have reviewed the triage vital signs and the nursing notes.  Pertinent labs & imaging results that were available during my care of the patient were reviewed by me and considered in my medical decision making (see chart for details).     Symptoms are consistent with lateral epicondylitis.  I counseled the patient to obtain a counterforce brace.  He is planning to see orthopedic surgery, and I counseled him that he might be a candidate for steroid injection.  No evidence of arthritis or ligamentous injury of the elbow. Final Clinical Impressions(s) / UC Diagnoses   Final diagnoses:  Lateral epicondylitis of right elbow     Discharge Instructions      Is an example of a counter force brace    ED Prescriptions    None     Controlled Substance Prescriptions [place] Controlled Substance Registry consulted? No   Marshell LevanMonroe,  G, MD 05/23/18 [id number]

## 2018-11-17 ENCOUNTER — Encounter (HOSPITAL_COMMUNITY): Payer: Self-pay | Admitting: Emergency Medicine

## 2018-11-17 ENCOUNTER — Other Ambulatory Visit: Payer: Self-pay

## 2018-11-17 ENCOUNTER — Ambulatory Visit (HOSPITAL_COMMUNITY)
Admission: EM | Admit: 2018-11-17 | Discharge: 2018-11-17 | Disposition: A | Discharge status: Home / Self Care | Payer: No Typology Code available for payment source | Attending: Family Medicine | Admitting: Family Medicine

## 2018-11-17 DIAGNOSIS — R1031 Right lower quadrant pain: Secondary | ICD-10-CM | POA: Diagnosis not present

## 2018-11-17 LAB — POCT URINALYSIS DIP (DEVICE)
Bilirubin Urine: NEGATIVE
Glucose, UA: NEGATIVE mg/dL
Hgb urine dipstick: NEGATIVE
Ketones, ur: NEGATIVE mg/dL
Leukocytes,Ua: NEGATIVE
Nitrite: NEGATIVE
Protein, ur: NEGATIVE mg/dL
Specific Gravity, Urine: 1.025 (ref 1.005–1.030)
Urobilinogen, UA: 1 mg/dL (ref 0.0–1.0)
pH: 7 (ref 5.0–8.0)

## 2018-11-17 NOTE — ED Provider Notes (Signed)
[facility]   [id number] 11/17/18 Arrival Time: 0911  ASSESSMENT & PLAN:  1. Groin pain, right    Benign abdominal/groin exam. No indications for urgent abdominal/pelvic imaging at this time. No evidence of recurrent hernia/strangulation. Discussed. Close observation. He plans to schedule f/u with his surgeon.   Discharge Instructions     Avoid heavy lifting/straining for the next one week.     Reviewed expectations re: course of current medical issues. Questions answered. Outlined signs and symptoms indicating need for more acute intervention. Patient verbalized understanding. After Visit Summary given.   SUBJECTIVE: History from: patient. Jeremy Singh is a 48 y.o. male who presents with complaint of "groin pain around where I had my hernia"; repaired 2-3 years ago. No bulging of area. Describes occasional dull pain with sharp exacerbations at times. Not limiting normal activities. Ambualtoery without difficulty. No extremity sensation changes or weakness. No specific aggravating or alleviating factors reported. Onset: first noticed yesterday. Mainly feels when at rest; "don't feel it when I'm working". Symptoms are stable since beginning. Fever: absent. He denies arthralgias, belching, constipation, diarrhea, fever, myalgias, nausea, sweats and vomiting. Appetite: normal. PO intake: normal. Urinary symptoms: "sometimes stings when I go"; sporadic. Bowel movements: have not significantly changed. OTC treatment: no analgesics taken/needed.   Past Surgical History:  Procedure Laterality Date  . INGUINAL HERNIA REPAIR Right 10/03/2015   Procedure: RIGHT HERNIA REPAIR INGUINAL INCARCERATED ;  Surgeon: Erroll Luna, MD;  Location: [place];  Service: General;  Laterality: Right;  . INGUINAL HERNIA REPAIR Right 10/12/2015   Procedure: RIGHT INGUINAL HERNIA REPAIR;  Surgeon: Donnie Mesa, MD;  Location: [place];  Service: General;  Laterality: Right;  . INSERTION OF MESH  Right 10/03/2015   Procedure: INSERTION OF MESH;  Surgeon: Erroll Luna, MD;  Location: [place];  Service: General;  Laterality: Right;  . INSERTION OF MESH Right 10/12/2015   Procedure: INSERTION OF MESH;  Surgeon: Donnie Mesa, MD;  Location: [place];  Service: General;  Laterality: Right;  . LAPAROSCOPY N/A 10/12/2015   Procedure: DIAGNOSTIC LAPAROSCOPY;  Surgeon: Donnie Mesa, MD;  Location: [place];  Service: General;  Laterality: N/A;    ROS: As per HPI. All other systems negative.  OBJECTIVE:  Vitals:   11/17/18 0931  BP: (!) 124/99  Pulse: 66  Resp: 16  Temp: 98.2 F (36.8 C)  TempSrc: Oral  SpO2: 100%    General appearance: alert, oriented, no acute distress Lungs: clear to auscultation bilaterally; unlabored respirations Heart: regular rate and rhythm Abdomen: soft; without distention; no tenderness; normal bowel sounds; without masses or organomegaly; without guarding or rebound tenderness Groin: slight tenderness over uppermost inner R thigh; high-riding testicle but this is unchanged since his hernia surgery; no testicular pain or swelling Back: without CVA tenderness; FROM at waist Extremities: without LE edema; symmetrical; without gross deformities Skin: warm and dry Neurologic: normal gait Psychological: alert and cooperative; normal mood and affect  Labs: Results for orders placed or performed during the hospital encounter of 11/17/18  POCT urinalysis dip (device)  Result Value Ref Range   Glucose, UA NEGATIVE NEGATIVE mg/dL   Bilirubin Urine NEGATIVE NEGATIVE   Ketones, ur NEGATIVE NEGATIVE mg/dL   Specific Gravity, Urine 1.025 1.005 - 1.030   Hgb urine dipstick NEGATIVE NEGATIVE   pH 7.0 5.0 - 8.0   Protein, ur NEGATIVE NEGATIVE mg/dL   Urobilinogen, UA 1.0 0.0 - 1.0 mg/dL   Nitrite NEGATIVE NEGATIVE   Leukocytes,Ua NEGATIVE NEGATIVE   Labs  Reviewed  POCT URINALYSIS DIP (DEVICE)     No Known Allergies                                             Past  Medical History:  Diagnosis Date  . Abscess of back    multiple other sites   Social History   Socioeconomic History  . Marital status: Single    Spouse name: Not on file  . Number of children: Not on file  . Years of education: Not on file  . Highest education level: Not on file  Occupational History  . Not on file  Social Needs  . Financial resource strain: Not on file  . Food insecurity:    Worry: Not on file    Inability: Not on file  . Transportation needs:    Medical: Not on file    Non-medical: Not on file  Tobacco Use  . Smoking status: Never Smoker  . Smokeless tobacco: Never Used  Substance and Sexual Activity  . Alcohol use: Yes    Comment: occasionally  . Drug use: No  . Sexual activity: Not on file  Lifestyle  . Physical activity:    Days per week: Not on file    Minutes per session: Not on file  . Stress: Not on file  Relationships  . Social connections:    Talks on phone: Not on file    Gets together: Not on file    Attends religious service: Not on file    Active member of club or organization: Not on file    Attends meetings of clubs or organizations: Not on file    Relationship status: Not on file  . Intimate partner violence:    Fear of current or ex partner: Not on file    Emotionally abused: Not on file    Physically abused: Not on file    Forced sexual activity: Not on file  Other Topics Concern  . Not on file  Social History Narrative  . Not on file   Family History  Problem Relation Age of Onset  . Cancer Mother   . Cancer Father     Mardella LaymanHagler, Dandra Shambaugh, MD 11/19/18 [phone]

## 2018-11-17 NOTE — ED Triage Notes (Signed)
Pt states he has hx of hernia surgery in 2017, states yesterday he noticed he had some R groin pain, same area of surgery, states also feels some sharp pains when he urinates.

## 2018-11-17 NOTE — Discharge Instructions (Addendum)
Avoid heavy lifting/straining for the next one week.

## 2018-11-17 NOTE — ED Notes (Signed)
Bed: UC08 Expected date:  Expected time:  Means of arrival:  Comments: Use for well patients

## 2020-03-19 IMAGING — DX DG ELBOW COMPLETE 3+V*R*
4 series · 4 of 4 positions shown · non-contrast
Comparison: None.

CLINICAL DATA: Patient states that he injured his right elbow x 1
year ago, continued pain with overuse from physical occupation. Pain
along lateral side.

EXAM:
RIGHT ELBOW - COMPLETE 3+ VIEW

[elbow ap]
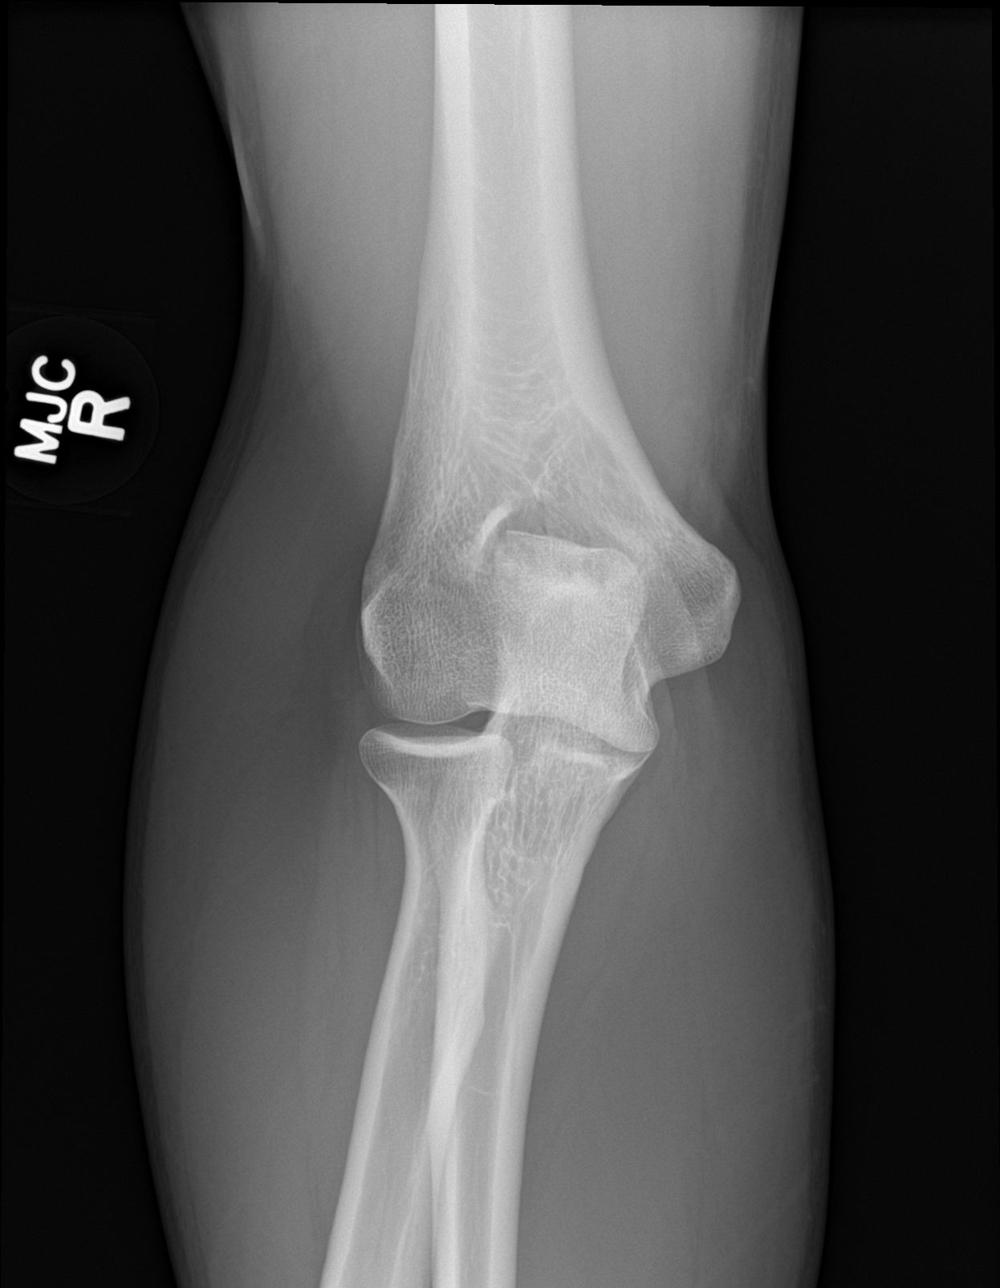

[elbow obl (1 of 2)]
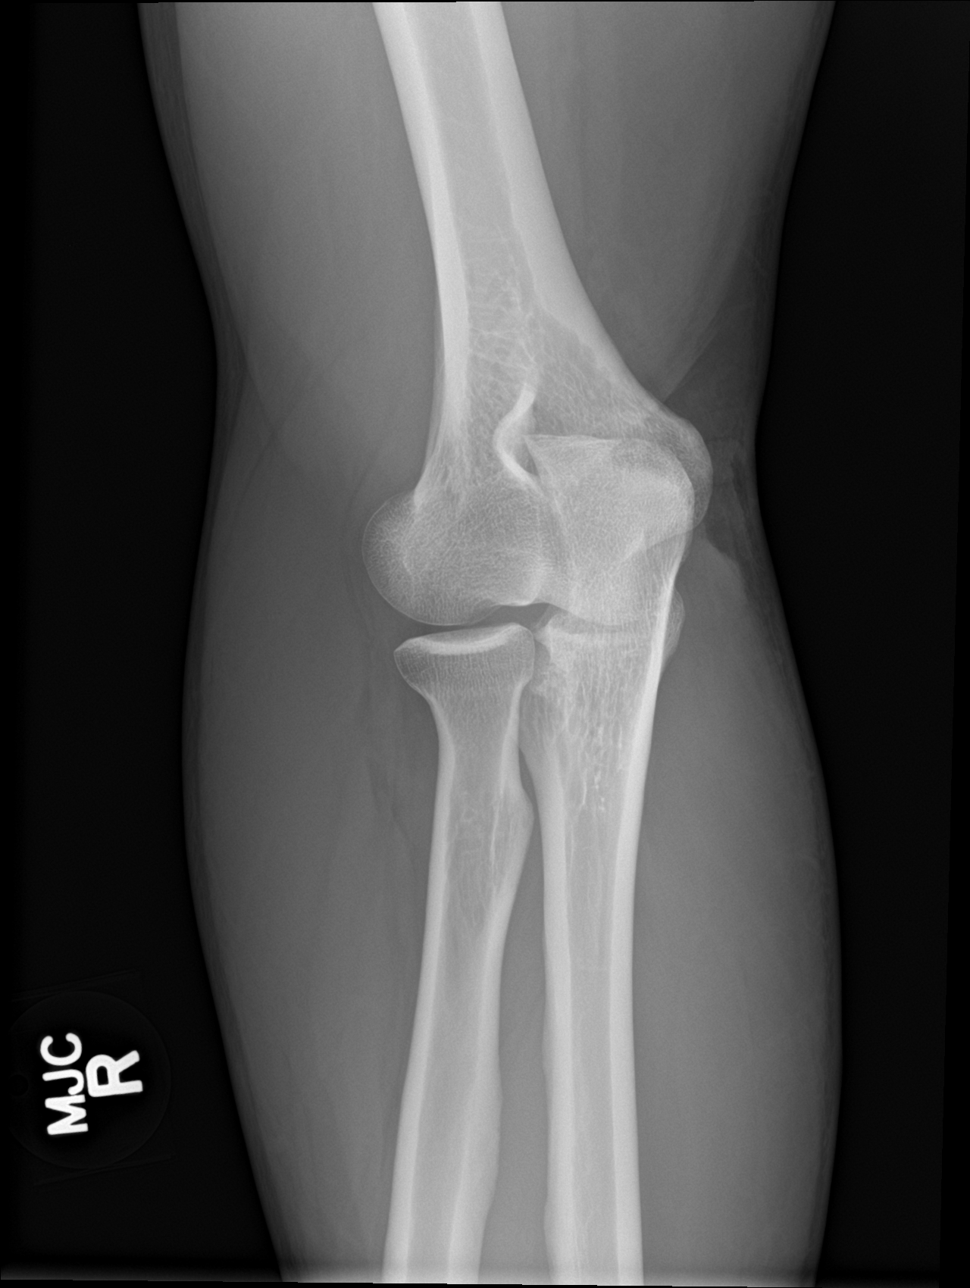

[elbow obl (2 of 2)]
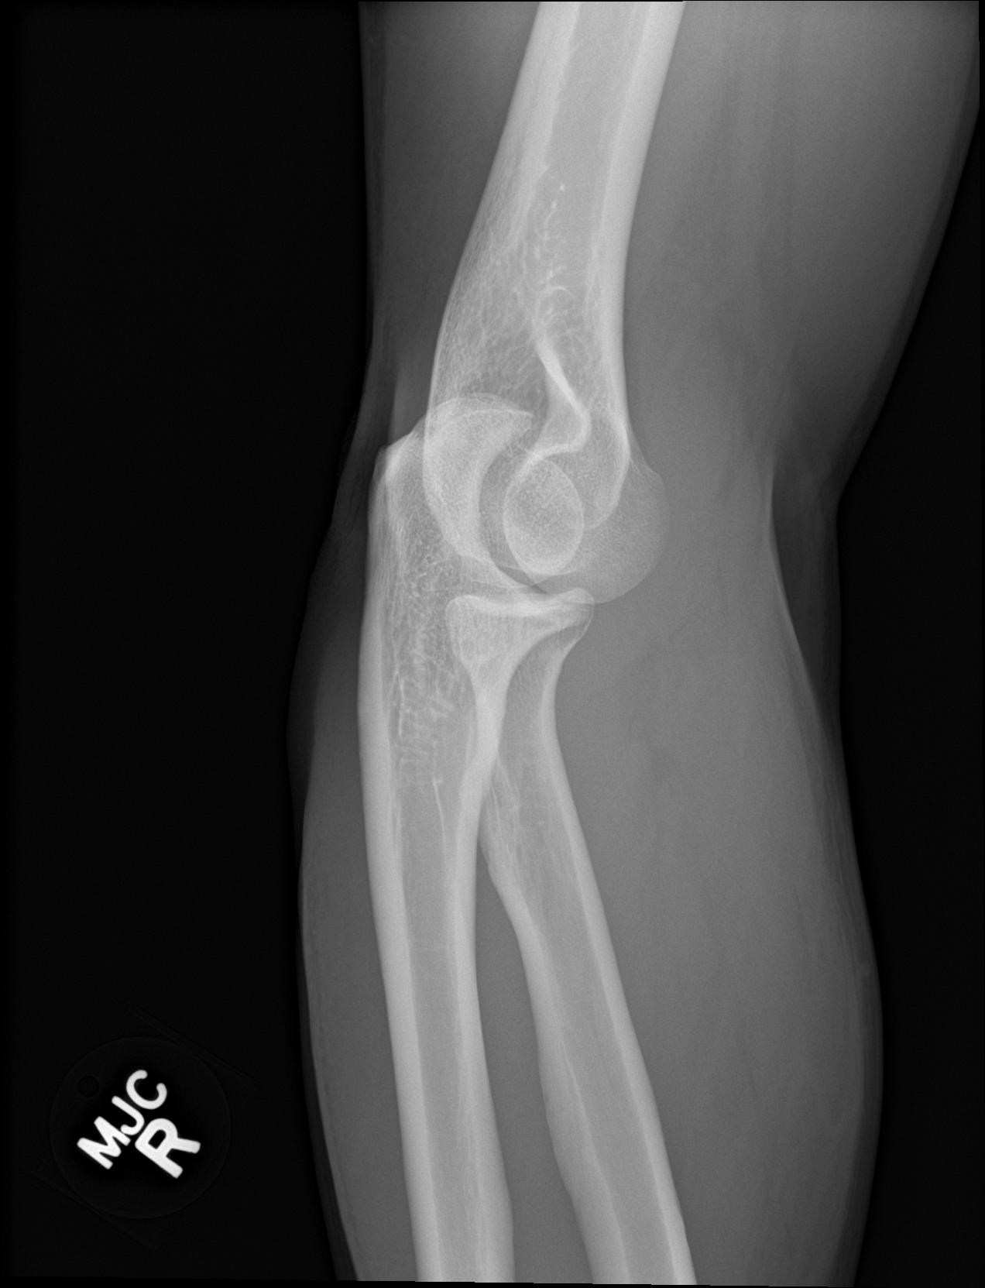

[elbow lat]
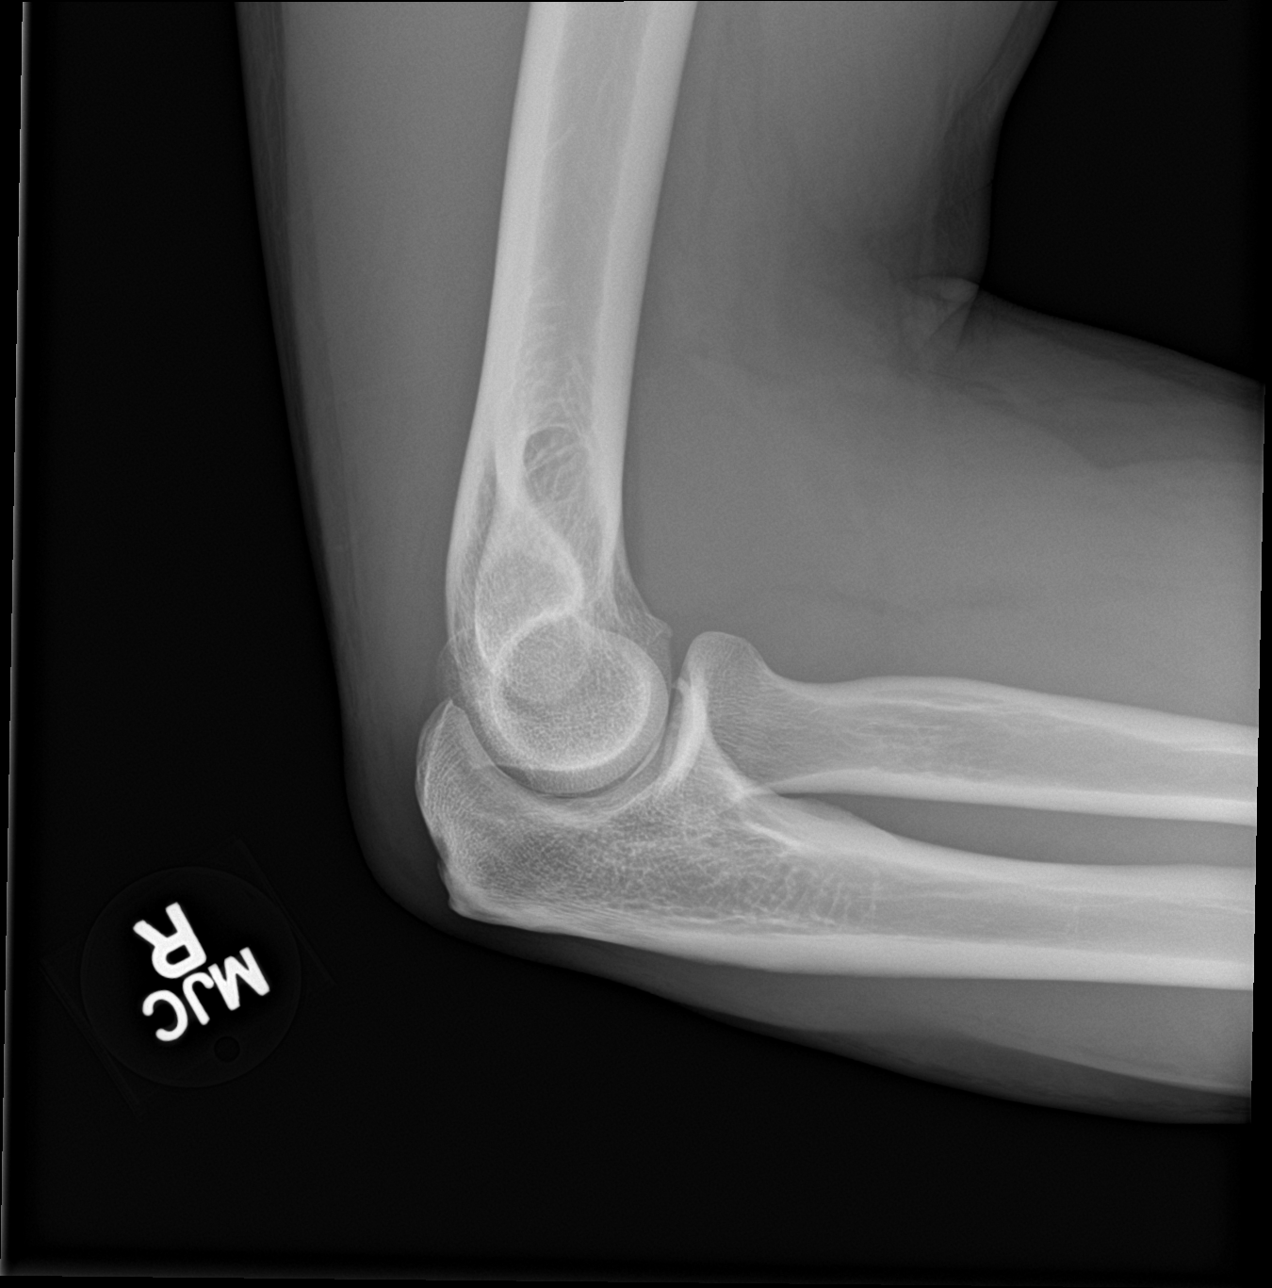

[4 of 4 positions shown; findings below may reference images not displayed]

FINDINGS: There is no evidence of fracture, dislocation, or joint effusion.
There is no evidence of arthropathy or other focal bone abnormality.
Soft tissues are unremarkable.
IMPRESSION: Negative.

## 2022-04-06 ENCOUNTER — Ambulatory Visit (HOSPITAL_COMMUNITY)
Admission: EM | Admit: 2022-04-06 | Discharge: 2022-04-06 | Disposition: A | Discharge status: Home / Self Care | Payer: No Typology Code available for payment source | Attending: Family Medicine | Admitting: Family Medicine

## 2022-04-06 ENCOUNTER — Encounter (HOSPITAL_COMMUNITY): Payer: Self-pay

## 2022-04-06 ENCOUNTER — Ambulatory Visit (INDEPENDENT_AMBULATORY_CARE_PROVIDER_SITE_OTHER): Payer: No Typology Code available for payment source

## 2022-04-06 DIAGNOSIS — M25511 Pain in right shoulder: Secondary | ICD-10-CM

## 2022-04-06 MED ORDER — IBUPROFEN 800 MG PO TABS: 800.0000 mg | ORAL_TABLET | Freq: Three times a day (TID) | ORAL | 0 refills | Status: AC | PRN

## 2022-04-06 NOTE — ED Triage Notes (Signed)
Right shoulder pain onset 2-3 months ago. Pain has gotten worse. No falls or known injuries. 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  No history of shoulder injuries, no arthritis.

## 2022-04-06 NOTE — ED Provider Notes (Addendum)
[facility]    CSN: [id number] Arrival date & time: 04/06/22  1307      History   Chief Complaint Chief Complaint  Patient presents with   Shoulder Pain    HPI Jeremy Singh is a 51 y.o. male.    Shoulder Pain  Here for right shoulder pain.  It began bothering him in the last 2 to 3 months.  It hurts to abduct it and it hurts to internally and externally rotated.  The pain is mainly on the anterior portion of the shoulder.  No fall or trauma.  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  No rash.  No paresthesias or numbness or weakness of the right arm.  No radiation of the pain  Past Medical History:  Diagnosis Date   Abscess of back    multiple other sites    Patient Active Problem List   Diagnosis Date Noted   S/P right inguinal hernia repair 10/12/15 10/08/2015   Incarcerated right inguinal hernia 10/03/2015   Right inguinal hernia 10/03/2015    Past Surgical History:  Procedure Laterality Date   INGUINAL HERNIA REPAIR Right 10/03/2015   Procedure: RIGHT HERNIA REPAIR INGUINAL INCARCERATED ;  Surgeon: Erroll Luna, MD;  Location: [place];  Service: General;  Laterality: Right;   INGUINAL HERNIA REPAIR Right 10/12/2015   Procedure: RIGHT INGUINAL HERNIA REPAIR;  Surgeon: Donnie Mesa, MD;  Location: [place];  Service: General;  Laterality: Right;   INSERTION OF MESH Right 10/03/2015   Procedure: INSERTION OF MESH;  Surgeon: Erroll Luna, MD;  Location: [place];  Service: General;  Laterality: Right;   INSERTION OF MESH Right 10/12/2015   Procedure: INSERTION OF MESH;  Surgeon: Donnie Mesa, MD;  Location: [place];  Service: General;  Laterality: Right;   LAPAROSCOPY N/A 10/12/2015   Procedure: DIAGNOSTIC LAPAROSCOPY;  Surgeon: Donnie Mesa, MD;  Location: [place];  Service: General;  Laterality: N/A;       Home Medications    Prior to Admission medications   Medication Sig Start Date End Date Taking? Authorizing  Provider  ibuprofen (ADVIL) 800 MG tablet Take 1 tablet (800 mg total) by mouth every 8 (eight) hours as needed (pain). 04/06/22  Yes Barrett Henle, MD  acetaminophen (TYLENOL) 500 MG tablet Take 2 tablets (1,000 mg total) by mouth every 6 (six) hours. 10/04/15   Earnstine Regal, PA-C    Family History Family History  Problem Relation Age of Onset   Cancer Mother    Cancer Father     Social History Social History   Tobacco Use   Smoking status: Never   Smokeless tobacco: Never  Substance Use Topics   Alcohol use: Yes    Comment: occasionally   Drug use: No     Allergies   Patient has no known allergies.   Review of Systems Review of Systems   Physical Exam Triage Vital Signs ED Triage Vitals  Enc Vitals Group     BP 04/06/22 1452 (!) 143/90     Pulse Rate 04/06/22 1452 (!) 56     Resp 04/06/22 1452 16     Temp 04/06/22 1452 97.7 F (36.5 C)     Temp Source 04/06/22 1452 Oral     SpO2 04/06/22 1452 100 %     Weight --      Height --      Head Circumference --  Peak Flow --      Pain Score 04/06/22 1451 9     Pain Loc --      Pain Edu? --      Excl. in [place]? --    No data found.  Updated Vital Signs BP (!) 143/90 (BP Location: Left Arm)   Pulse (!) 56   Temp 97.7 F (36.5 C) (Oral)   Resp 16   SpO2 100%   Visual Acuity Right Eye Distance:   Left Eye Distance:   Bilateral Distance:    Right Eye Near:   Left Eye Near:    Bilateral Near:     Physical Exam Vitals reviewed.  Constitutional:      General: He is not in acute distress.    Appearance: He is not ill-appearing, toxic-appearing or diaphoretic.  HENT:     Mouth/Throat:     Mouth: Mucous membranes are moist.  Eyes:     Extraocular Movements: Extraocular movements intact.     Conjunctiva/sclera: Conjunctivae normal.     Pupils: Pupils are equal, round, and reactive to light.  Cardiovascular:     Rate and Rhythm: Normal rate and regular rhythm.     Heart sounds: No murmur  heard. Pulmonary:     Effort: Pulmonary effort is normal.     Breath sounds: Normal breath sounds.  Musculoskeletal:     Cervical back: Neck supple.     Comments: There is tenderness on the anterior aspect of the right shoulder.  Range of motion causes pain at the shoulder joint, on abduction and internal rotation.  Lymphadenopathy:     Cervical: No cervical adenopathy.  Neurological:     Mental Status: He is alert.      UC Treatments / Results  Labs (all labs ordered are listed, but only abnormal results are displayed) Labs Reviewed - No data to display  EKG   Radiology DG Shoulder Right  Result Date: 04/06/2022 CLINICAL DATA:  Right shoulder pain. EXAM: RIGHT SHOULDER - 2+ VIEW COMPARISON:  None Available. FINDINGS: There is no evidence of fracture or dislocation. Os acromiale is present. There is no evidence of arthropathy or other focal bone abnormality. Soft tissues are unremarkable. IMPRESSION: 1. No acute fracture or dislocation. 2. Os acromiale. Electronically Signed   By: Ronney Asters M.D.   On: 04/06/2022 15:20    Procedures Procedures (including critical care time)  Medications Ordered in UC Medications - No data to display  Initial Impression / Assessment and Plan / UC Course  I have reviewed the triage vital signs and the nursing notes.  Pertinent labs & imaging results that were available during my care of the patient were reviewed by me and considered in my medical decision making (see chart for details).        There is no acute bony abnormality.  There is an os acromiale noted on the radiology reading.  Prescription anti-inflammatory is sent in and he is giving range of motion exercises for his shoulder.  He is given contact information for orthopedics and also is shown how to schedule a new patient appointment for primary care Final Clinical Impressions(s) / UC Diagnoses   Final diagnoses:  Acute pain of right shoulder     Discharge Instructions       X-ray did not show any bony abnormality.  Take ibuprofen 800 mg--1 tab every 8 hours as needed for pain.  You can use the QR code/website at the back of the summary paperwork to schedule  yourself a new patient appointment with primary care       ED Prescriptions     Medication Sig Dispense Auth. Provider   ibuprofen (ADVIL) 800 MG tablet Take 1 tablet (800 mg total) by mouth every 8 (eight) hours as needed (pain). 21 tablet Analese Sovine, Gwenlyn Perking, MD      I have reviewed the PDMP during this encounter.   Barrett Henle, MD 04/06/22 1527    Barrett Henle, MD 04/06/22 [id number]

## 2022-04-06 NOTE — Discharge Instructions (Addendum)
X-ray did not show any bony abnormality.  Take ibuprofen 800 mg--1 tab every 8 hours as needed for pain.  You can use the QR code/website at the back of the summary paperwork to schedule yourself a new patient appointment with primary care

## 2024-04-01 ENCOUNTER — Ambulatory Visit (HOSPITAL_COMMUNITY)
Admission: RE | Admit: 2024-04-01 | Discharge: 2024-04-01 | Disposition: A | Discharge status: Home / Self Care | Payer: Self-pay | Source: Ambulatory Visit | Attending: Internal Medicine | Admitting: Internal Medicine

## 2024-04-01 ENCOUNTER — Ambulatory Visit (INDEPENDENT_AMBULATORY_CARE_PROVIDER_SITE_OTHER): Payer: Self-pay

## 2024-04-01 ENCOUNTER — Encounter (HOSPITAL_COMMUNITY): Payer: Self-pay

## 2024-04-01 VITALS — BP 155/96 | HR 66 | Temp 98.4°F | Resp 16

## 2024-04-01 DIAGNOSIS — M25572 Pain in left ankle and joints of left foot: Secondary | ICD-10-CM

## 2024-04-01 DIAGNOSIS — S93422A Sprain of deltoid ligament of left ankle, initial encounter: Secondary | ICD-10-CM

## 2024-04-01 NOTE — ED Provider Notes (Signed)
 [facility]    CSN: [id number] Arrival date & time: 04/01/24  9087      History   Chief Complaint Chief Complaint  Patient presents with   Ankle Pain    need to know how bad it is. work at a [employer]. On my feet all day. Hurt it in my yard 03/22/2024 - Entered by patient    HPI YU PEGGS is a 53 y.o. male presenting to urgent care for evaluation of left ankle injury.  He endorses left medial ankle pain after stepping in a hole in his yard 2 weeks ago.  He is able to bear weight but endorses discomfort.  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  He points posterior to the medial malleolus when asked where his discomfort is most prominent.  He has tried applying ice and Tiger balm.  He is also wearing a wrap around his ankle for support.  Denies a prior history of a injury to the left ankle.  Past Medical History:  Diagnosis Date   Abscess of back    multiple other sites    Patient Active Problem List   Diagnosis Date Noted   S/P right inguinal hernia repair 10/12/15 10/08/2015   Incarcerated right inguinal hernia 10/03/2015   Right inguinal hernia 10/03/2015    Past Surgical History:  Procedure Laterality Date   INGUINAL HERNIA REPAIR Right 10/03/2015   Procedure: RIGHT HERNIA REPAIR INGUINAL INCARCERATED ;  Surgeon: Debby Shipper, MD;  [facility];  Service: General;  Laterality: Right;   INGUINAL HERNIA REPAIR Right 10/12/2015   Procedure: RIGHT INGUINAL HERNIA REPAIR;  Surgeon: Donnice Lima, MD;  [facility];  Service: General;  Laterality: Right;   INSERTION OF MESH Right 10/03/2015   Procedure: INSERTION OF MESH;  Surgeon: Debby Shipper, MD;  Location: [facility];  Service: General;  Laterality: Right;   INSERTION OF MESH Right 10/12/2015   Procedure: INSERTION OF MESH;  Surgeon: Donnice Lima, MD;  [facility];  Service: General;  Laterality: Right;   LAPAROSCOPY  N/A 10/12/2015   Procedure: DIAGNOSTIC LAPAROSCOPY;  Surgeon: Donnice Lima, MD;  [facility];  Service: General;  Laterality: N/A;     Home Medications    Prior to Admission medications   Medication Sig Start Date End Date Taking? Authorizing Provider  acetaminophen  (TYLENOL ) 500 MG tablet Take 2 tablets (1,000 mg total) by mouth every 6 (six) hours. 10/04/15   Tonnie George, PA-C  ibuprofen  (ADVIL ) 800 MG tablet Take 1 tablet (800 mg total) by mouth every 8 (eight) hours as needed (pain). 04/06/22   Vonna Sharlet POUR, MD    Family History Family History  Problem Relation Age of Onset   Cancer Mother    Cancer Father     Social History Social History   Tobacco Use   Smoking status: Never   Smokeless tobacco: Never  Substance Use Topics   Alcohol use: Yes    Comment: occasionally   Drug use: No     Allergies   Patient has no known allergies.   Review of Systems Review of Systems  Musculoskeletal:        Left medial ankle pain     Physical Exam Triage Vital Signs ED Triage Vitals  Encounter Vitals Group     BP 04/01/24 0931 (!) 155/96     Girls Systolic BP Percentile --  Girls Diastolic BP Percentile --      Boys Systolic BP Percentile --      Boys Diastolic BP Percentile --      Pulse Rate 04/01/24 0931 66     Resp 04/01/24 0931 16     Temp 04/01/24 0931 98.4 F (36.9 C)     Temp Source 04/01/24 0931 Oral     SpO2 04/01/24 0931 96 %     Weight --      Height --      Head Circumference --      Peak Flow --      Pain Score 04/01/24 0930 10     Pain Loc --      Pain Education --      Exclude from Growth Chart --    No data found.  Updated Vital Signs BP (!) 155/96 (BP Location: Left Arm)   Pulse 66   Temp 98.4 F (36.9 C) (Oral)   Resp 16   SpO2 96%   Physical Exam Vitals reviewed.  Constitutional:      Appearance: He is normal weight.  Musculoskeletal:     Comments: No obvious deformity or swelling on inspection of the left  ankle.  He displays normal active plantarflexion and dorsiflexion.  Active inversion and eversion are limited secondary to pain.  No TTP over the medial malleolus, lateral malleolus, navicular head, or base of the fifth metatarsal.  Negative calcaneal squeeze.  Negative syndesmosis squeeze.  TTP posterior to the medial malleolus.  Grossly NV intact. Can bear weight but displays an antalgic gait.  Skin:    General: Skin is warm and dry.  Neurological:     Mental Status: He is alert.    UC Treatments / Results  Labs (all labs ordered are listed, but only abnormal results are displayed) Labs Reviewed - No data to display  EKG   Radiology No results found.  Procedures Procedures (including critical care time)  Medications Ordered in UC Medications - No data to display  Initial Impression / Assessment and Plan / UC Course  I have reviewed the triage vital signs and the nursing notes.  Pertinent labs & imaging results that were available during my care of the patient were reviewed by me and considered in my medical decision making (see chart for details).    Patient is a 53 year old male presenting to urgent care for left medial ankle pain after an injury 2 weeks ago.  He stepped in a hole in his yard.  On exam there is no swelling.  He has tenderness palpation posterior to the medial malleolus.  No bony tenderness.  Mild limitation of inversion and eversion due to pain.  He is able to ambulate independently.  X-rays ordered today not show any acute findings or evidence of fracture on my interpretation.  Radiology read is still pending.  His history and exam findings today seem as consistent with a sprain.  Treatment options reviewed.  Recommend NSAIDs and icing for pain relief.  He has an ankle wrap.  We discussed using an ASO.  He was shown one in urgent care today and states that he will purchase one online.  We also reviewed basic range of motion exercises to help with rehabilitation.  He  is agreeable to this plan and is medically stable for discharge at this time.  He understands that we will contact him if radiology review imaging is notable for any acute findings.  Final Clinical Impressions(s) / UC Diagnoses  Final diagnoses:  Acute left ankle pain  Sprain of left medial ankle joint, initial encounter     Discharge Instructions      You have a sprained ankle. As we discussed, I recommend using anti-inflammatories and ice for pain relief. Consider purchasing a lace up ankle brace like the one you were shown today. Perform the basic range of motion exercises we reviewed today. Follow up if pain worsens or does not improve.      ED Prescriptions   None    PDMP not reviewed this encounter.   Melvenia Manus BRAVO, MD 04/01/24 1016

## 2024-04-01 NOTE — Discharge Instructions (Signed)
 You have a sprained ankle. As we discussed, I recommend using anti-inflammatories and ice for pain relief. Consider purchasing a lace up ankle brace like the one you were shown today. Perform the basic range of motion exercises we reviewed today. Follow up if pain worsens or does not improve.

## 2024-04-01 NOTE — ED Triage Notes (Signed)
 Pt states he twisted his left ankle on 03/22/2024. States his is still having pain and swelling.  Pt ambulates with a slight limp.  States she has been using tiger balm at home and has been wrapping it.

## 2024-05-25 ENCOUNTER — Ambulatory Visit: Admitting: Family Medicine

## 2024-05-25 NOTE — Progress Notes (Deleted)
° °  Acute Office Visit  Subjective:     Patient ID: Jeremy Singh, male    DOB: 1970/09/12, 53 y.o.   MRN: [id number]  No chief complaint on file.   HPI  Discussed the use of AI scribe software for clinical note transcription with the patient, who gave verbal consent to proceed.  History of Present Illness      ROS Per HPI      Objective:    There were no vitals taken for this visit.   Physical Exam Vitals and nursing note reviewed.  Constitutional:      General: He is not in acute distress.    Appearance: Normal appearance.  HENT:     Head: Normocephalic and atraumatic.     Right Ear: External ear normal.     Left Ear: External ear normal.     Nose: Nose normal.     Mouth/Throat:     Mouth: Mucous membranes are moist.     Pharynx: Oropharynx is clear.  Eyes:     Extraocular Movements: Extraocular movements intact.  Cardiovascular:     Rate and Rhythm: Normal rate and regular rhythm.     Pulses: Normal pulses.     Heart sounds: Normal heart sounds.  Pulmonary:     Effort: Pulmonary effort is normal. No respiratory distress.     Breath sounds: Normal breath sounds. No wheezing, rhonchi or rales.  Musculoskeletal:        General: Normal range of motion.     Cervical back: Normal range of motion.     Right lower leg: No edema.     Left lower leg: No edema.  Lymphadenopathy:     Cervical: No cervical adenopathy.  Skin:    General: Skin is warm and dry.  Neurological:     General: No focal deficit present.     Mental Status: He is alert and oriented to person, place, and time.  Psychiatric:        Mood and Affect: Mood normal.        Behavior: Behavior normal.     No results found for any visits on 05/25/24.      Assessment & Plan:   Assessment and Plan Assessment & Plan      No orders of the defined types were placed in this encounter.    No orders of the defined types were placed in this encounter.   No follow-ups on  file.  Corean LITTIE Ku, FNP

## 2024-05-27 ENCOUNTER — Encounter: Payer: Self-pay | Admitting: Family Medicine

## 2024-05-27 ENCOUNTER — Ambulatory Visit: Admitting: Family Medicine

## 2024-05-27 VITALS — BP 140/90 | HR 77 | Temp 97.7°F | Ht 66.0 in | Wt 194.0 lb

## 2024-05-27 DIAGNOSIS — R030 Elevated blood-pressure reading, without diagnosis of hypertension: Secondary | ICD-10-CM

## 2024-05-27 DIAGNOSIS — M25572 Pain in left ankle and joints of left foot: Secondary | ICD-10-CM | POA: Diagnosis not present

## 2024-05-27 NOTE — Progress Notes (Signed)
 Acute Office Visit  Subjective:     Patient ID: Jeremy Singh, male    DOB: Sep 26, 1970, 53 y.o.   MRN: [id number]  Chief Complaint  Patient presents with   Establish Care    Sprained LT ankle in October has been using RICE method still having issues working     HPI  53 yo male presents for evaluation of continued left ankle pain since early October. Had negative x-ray with urgent care on 04/01/2024.  Has been wearing lace up brace and using single crutch recently to help with pain and to take some weight off of it so we will heal. Denies any popping, bruising. Reports that ankle is still swollen at the end of the day. 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  Denies previous blood pressure issues. Denies headaches, chest pain, palpitations, lower extremity swelling, dizziness, vision changes, other concerns today.  ROS Per HPI      Objective:    BP (!) 140/90   Pulse 77   Temp 97.7 F (36.5 C) (Temporal)   Ht 5' 6 (1.676 m)   Wt 194 lb (88 kg)   SpO2 99%   BMI 31.31 kg/m    Physical Exam Vitals and nursing note reviewed.  Constitutional:      General: He is not in acute distress.    Appearance: Normal appearance.  HENT:     Head: Normocephalic and atraumatic.     Right Ear: External ear normal.     Left Ear: External ear normal.     Nose: Nose normal.     Mouth/Throat:     Mouth: Mucous membranes are moist.     Pharynx: Oropharynx is clear.  Eyes:     Extraocular Movements: Extraocular movements intact.  Cardiovascular:     Rate and Rhythm: Normal rate and regular rhythm.     Pulses: Normal pulses.     Heart sounds: Normal heart sounds.  Pulmonary:     Effort: Pulmonary effort is normal. No respiratory distress.     Breath sounds: Normal breath sounds. No wheezing, rhonchi or rales.  Musculoskeletal:     Cervical back: Normal range of motion.     Right lower leg: No edema.     Left lower leg: No edema.     Left ankle: Tenderness  present over the lateral malleolus. Decreased range of motion.     Comments: Mild swelling and tenderness to lateral malleolus, no erythema, no bruising, no obvious deformity Using single crutch with lace up ankle brace  Lymphadenopathy:     Cervical: No cervical adenopathy.  Skin:    General: Skin is warm and dry.  Neurological:     General: No focal deficit present.     Mental Status: He is alert and oriented to person, place, and time.  Psychiatric:        Mood and Affect: Mood normal.        Behavior: Behavior normal.     No results found for any visits on 05/27/24.      Assessment & Plan:   Acute left ankle pain -     MR ANKLE LEFT WO CONTRAST; Future  Elevated blood pressure reading in office without diagnosis of hypertension  Check BP at home once daily in the mornings for the next month. Follow up with me in a month to recheck blood pressures.     Orders Placed This Encounter  Procedures   MR ANKLE LEFT WO  CONTRAST    Standing Status:   Future    Expiration Date:   05/27/2025    What is the patient's sedation requirement?:   No Sedation    Does the patient have a pacemaker or implanted devices?:   No    Preferred imaging location?:   [facility] (table limit-550lbs)     No orders of the defined types were placed in this encounter.   Return for BP readings in 2-3 weeks.  Jeremy LITTIE Ku, FNP

## 2024-05-27 NOTE — Patient Instructions (Addendum)
 Welcome to [facility]!  Thank you for choosing us  for your Primary Care needs.   We offer in person and video appointments for your convenience. You may call our office to schedule appointments, or you may schedule appointments with me through MyChart.   The best way to get in contact with me is via MyChart message. This will get to me faster than a phone call, unless there is an emergency, then please call 911.  Check BP at home once daily in the mornings for the next month. Follow up with me in a month to recheck blood pressures.  We have ordered an MRI for your left ankle. Someone will be reaching out to get you scheduled. We will be in contact with results once they are received.

## 2024-05-28 ENCOUNTER — Telehealth: Payer: Self-pay

## 2024-05-28 NOTE — Addendum Note (Signed)
 Addended by: LENARD WILFORD RAMAN on: 05/28/2024 10:39 AM   Modules accepted: Orders

## 2024-05-28 NOTE — Telephone Encounter (Signed)
 Copied from CRM #[id number]. Topic: Clinical - Request for Lab/Test Order >> May 28, 2024  1:30 PM Franky GRADE wrote: Reason for CRM: Patient is calling because they called to schedule an MRI ordered by Corean Ku; however, they informed patient his insurance won't cover the exam and to call our office to find out where he can get it done. He also has questions regarding a boot that was recommended. They don't have it in stock and he wanted to know if there is a different boot he can get.

## 2024-05-28 NOTE — Telephone Encounter (Signed)
 Spoke with patient, let him know we updated the MRI location and shara is pending. Patient will get boot from [facility] that provider showed him

## 2024-06-30 ENCOUNTER — Telehealth: Payer: Self-pay

## 2024-06-30 NOTE — Telephone Encounter (Signed)
 Copied from CRM [id number]. Topic: Clinical - Medical Advice >> Jun 30, 2024  9:26 AM Gustabo D wrote: Pt has added has added [employer] still has both - pt wants to know to if his pcp can find a place that takes his [employer] for him to get his MRI

## 2024-06-30 NOTE — Telephone Encounter (Signed)
 LVM for patient to return call

## 2024-06-30 NOTE — Telephone Encounter (Signed)
 Addressed via MyChart.

## 2024-06-30 NOTE — Telephone Encounter (Signed)
 Copied from CRM [id number]. Topic: General - Other >> Jun 30, 2024 12:07 PM [name] wrote: Reason for CRM: patient returning Jeremy Singh call asking for a callback. If he doesn't answer a mychart message is fine.

## 2024-07-17 ENCOUNTER — Ambulatory Visit (HOSPITAL_BASED_OUTPATIENT_CLINIC_OR_DEPARTMENT_OTHER): Admission: RE | Admit: 2024-07-17 | Source: Ambulatory Visit

## 2024-07-18 ENCOUNTER — Ambulatory Visit (HOSPITAL_BASED_OUTPATIENT_CLINIC_OR_DEPARTMENT_OTHER)
